# Patient Record
Sex: Male | Born: 1950 | Race: Black or African American | Hispanic: No | Marital: Married | Smoking: Never smoker
Health system: Southern US, Community
[De-identification: ages and names within clinical notes are randomized; demographics above are authoritative.]

## PROBLEM LIST (undated history)

## (undated) DIAGNOSIS — I4891 Unspecified atrial fibrillation: Secondary | ICD-10-CM

## (undated) DIAGNOSIS — E119 Type 2 diabetes mellitus without complications: Secondary | ICD-10-CM

## (undated) DIAGNOSIS — C61 Malignant neoplasm of prostate: Secondary | ICD-10-CM

## (undated) DIAGNOSIS — I10 Essential (primary) hypertension: Secondary | ICD-10-CM

## (undated) HISTORY — PX: PROSTATE BIOPSY: SHX241

---

## 2001-06-07 ENCOUNTER — Encounter: Payer: Self-pay | Admitting: General Practice

## 2001-06-07 ENCOUNTER — Encounter: Admission: RE | Admit: 2001-06-07 | Discharge: 2001-06-07 | Payer: Self-pay | Admitting: General Practice

## 2013-07-17 ENCOUNTER — Other Ambulatory Visit: Payer: Self-pay | Admitting: Gastroenterology

## 2013-07-17 DIAGNOSIS — Z1211 Encounter for screening for malignant neoplasm of colon: Secondary | ICD-10-CM

## 2013-07-20 ENCOUNTER — Other Ambulatory Visit: Payer: Self-pay

## 2013-12-13 ENCOUNTER — Ambulatory Visit
Admission: RE | Admit: 2013-12-13 | Discharge: 2013-12-13 | Disposition: A | Discharge status: Home / Self Care | Payer: BC Managed Care – PPO | Source: Ambulatory Visit | Attending: Internal Medicine | Admitting: Internal Medicine

## 2013-12-13 ENCOUNTER — Other Ambulatory Visit: Payer: Self-pay | Admitting: Internal Medicine

## 2013-12-13 DIAGNOSIS — J209 Acute bronchitis, unspecified: Secondary | ICD-10-CM

## 2015-02-03 ENCOUNTER — Emergency Department (HOSPITAL_COMMUNITY): Payer: No Typology Code available for payment source

## 2015-02-03 ENCOUNTER — Emergency Department (HOSPITAL_COMMUNITY)
Admission: EM | Admit: 2015-02-03 | Discharge: 2015-02-03 | Disposition: A | Discharge status: Home / Self Care | Payer: No Typology Code available for payment source | Source: Home / Self Care | Attending: Emergency Medicine | Admitting: Emergency Medicine

## 2015-02-03 ENCOUNTER — Encounter (HOSPITAL_COMMUNITY): Payer: Self-pay | Admitting: *Deleted

## 2015-02-03 DIAGNOSIS — R05 Cough: Secondary | ICD-10-CM

## 2015-02-03 DIAGNOSIS — R059 Cough, unspecified: Secondary | ICD-10-CM

## 2015-02-03 HISTORY — DX: Type 2 diabetes mellitus without complications: E11.9

## 2015-02-03 HISTORY — DX: Essential (primary) hypertension: I10

## 2015-02-03 MED ORDER — PREDNISONE 50 MG PO TABS: ORAL_TABLET | ORAL | Status: DC

## 2015-02-03 MED ORDER — AZITHROMYCIN 250 MG PO TABS: ORAL_TABLET | ORAL | Status: DC

## 2015-02-03 NOTE — ED Notes (Signed)
3rd day of myalgias, fever--did not check at home--and productive cough

## 2015-02-03 NOTE — Discharge Instructions (Signed)
I am worried that you have an infection in your lungs. Take prednisone 1 pill daily for 5 days. Take the azithromycin 2 pills today, then 1 pill daily until gone. If you are not getting better in 2 days, please follow-up here or with Dr. Jeanie Cooks.

## 2015-02-03 NOTE — ED Provider Notes (Signed)
CSN: [id number]     Arrival date & time 02/03/15  1132 History   First MD Initiated Contact with Patient 02/03/15 1224     Chief Complaint  Patient presents with  . Cough   (Consider location/radiation/quality/duration/timing/severity/associated sxs/prior Treatment) HPI  He is a 64 year old man here for evaluation of cough. He states this started on Friday with cough, subjective fever, chills, body aches. He denies any nasal congestion or rhinorrhea. No sore throat. No nausea or vomiting. He states he does sometimes feel short of breath. He will have some chest pain with coughing. He has been taking an over-the-counter cough medicine and Advil with some improvement. He states he saw his doctor on Friday, but was not prescribed any medication.  Past Medical History  Diagnosis Date  . Hypertension   . Diabetes mellitus without complication    History reviewed. No pertinent past surgical history. No family history on file. History  Substance Use Topics  . Smoking status: Never Smoker   . Smokeless tobacco: Not on file  . Alcohol Use: No    Review of Systems  Constitutional: Positive for fever and chills. Negative for activity change and appetite change.  HENT: Negative for congestion, rhinorrhea and sore throat.   Respiratory: Positive for cough and shortness of breath.   Cardiovascular: Positive for chest pain (with cough).  Gastrointestinal: Negative for nausea and vomiting.  Musculoskeletal: Positive for myalgias and arthralgias.    Allergies  Review of patient's allergies indicates no known allergies.  Home Medications   Prior to Admission medications   Medication Sig Start Date End Date Taking? Authorizing Provider  aspirin 81 MG tablet Take 81 mg by mouth daily.   Yes Historical Provider, MD  metFORMIN (GLUCOPHAGE) 500 MG tablet Take 500 mg by mouth 2 (two) times daily with a meal.   Yes Historical Provider, MD  azithromycin (ZITHROMAX Z-PAK) 250 MG tablet Take 2 pills  today, then 1 pill daily until gone. 02/03/15   Melony Overly, MD  predniSONE (DELTASONE) 50 MG tablet Take 1 pill daily for 5 days. 02/03/15   Melony Overly, MD   BP 120/78 mmHg  Pulse 79  Temp(Src) 98.6 F (37 C) (Oral)  Resp 18  SpO2 96% Physical Exam  Constitutional: He appears well-developed and well-nourished. No distress.  HENT:  Nose: Nose normal.  Mouth/Throat: Oropharynx is clear and moist. No oropharyngeal exudate.  Neck: Neck supple.  Cardiovascular: Normal rate, regular rhythm and normal heart sounds.   No murmur heard. Pulmonary/Chest: Effort normal and breath sounds normal. No respiratory distress. He has no wheezes. He has no rales.  Lymphadenopathy:    He has no cervical adenopathy.    ED Course  Procedures (including critical care time) Labs Review Labs Reviewed - No data to display  Imaging Review No results found.   MDM   1. Cough    He declined a chest x-ray. He does not have a fever here, but did take Advil this morning. I'm concerned about pneumonia versus bronchitis. We'll treat with azithromycin and prednisone. If no improvement in 2 days he will follow-up here or with his PCP.    Melony Overly, MD 02/03/15 1311

## 2016-01-30 ENCOUNTER — Other Ambulatory Visit: Payer: Self-pay | Admitting: Internal Medicine

## 2016-01-30 ENCOUNTER — Ambulatory Visit
Admission: RE | Admit: 2016-01-30 | Discharge: 2016-01-30 | Disposition: A | Discharge status: Home / Self Care | Payer: BLUE CROSS/BLUE SHIELD | Source: Ambulatory Visit | Attending: Internal Medicine | Admitting: Internal Medicine

## 2016-01-30 DIAGNOSIS — M1990 Unspecified osteoarthritis, unspecified site: Secondary | ICD-10-CM

## 2017-01-17 ENCOUNTER — Other Ambulatory Visit: Payer: Self-pay | Admitting: Urology

## 2017-01-17 DIAGNOSIS — C61 Malignant neoplasm of prostate: Secondary | ICD-10-CM

## 2017-01-31 ENCOUNTER — Encounter (HOSPITAL_COMMUNITY)
Admission: RE | Admit: 2017-01-31 | Discharge: 2017-01-31 | Disposition: A | Discharge status: Home / Self Care | Payer: BLUE CROSS/BLUE SHIELD | Source: Ambulatory Visit | Attending: Urology | Admitting: Urology

## 2017-01-31 ENCOUNTER — Ambulatory Visit (HOSPITAL_COMMUNITY)
Admission: RE | Admit: 2017-01-31 | Discharge: 2017-01-31 | Disposition: A | Discharge status: Home / Self Care | Payer: BLUE CROSS/BLUE SHIELD | Source: Ambulatory Visit | Attending: Urology | Admitting: Urology

## 2017-01-31 DIAGNOSIS — C61 Malignant neoplasm of prostate: Secondary | ICD-10-CM | POA: Diagnosis present

## 2017-01-31 DIAGNOSIS — M47894 Other spondylosis, thoracic region: Secondary | ICD-10-CM | POA: Insufficient documentation

## 2017-01-31 DIAGNOSIS — M47896 Other spondylosis, lumbar region: Secondary | ICD-10-CM | POA: Insufficient documentation

## 2017-01-31 MED ORDER — TECHNETIUM TC 99M MEDRONATE IV KIT
21.5000 | PACK | Freq: Once | INTRAVENOUS | Status: AC | PRN
  Administered 2017-01-31: 21.5 via INTRAVENOUS

## 2017-02-03 ENCOUNTER — Encounter: Payer: Self-pay | Admitting: Radiation Oncology

## 2017-02-04 ENCOUNTER — Encounter: Payer: Self-pay | Admitting: Radiation Oncology

## 2017-02-07 ENCOUNTER — Telehealth: Payer: Self-pay | Admitting: Radiation Oncology

## 2017-02-07 ENCOUNTER — Ambulatory Visit
Admission: RE | Admit: 2017-02-07 | Discharge: 2017-02-07 | Disposition: A | Discharge status: Home / Self Care | Payer: BLUE CROSS/BLUE SHIELD | Source: Ambulatory Visit | Attending: Radiation Oncology | Admitting: Radiation Oncology

## 2017-02-07 ENCOUNTER — Ambulatory Visit: Admission: RE | Admit: 2017-02-07 | Payer: BLUE CROSS/BLUE SHIELD | Source: Ambulatory Visit

## 2017-02-07 DIAGNOSIS — C61 Malignant neoplasm of prostate: Secondary | ICD-10-CM

## 2017-02-07 HISTORY — DX: Malignant neoplasm of prostate: C61

## 2017-02-07 NOTE — Progress Notes (Signed)
  Radiation Oncology         [phone]) [phone] ________________________________  Initial outpatient Consultation  Name: Phillip Williams MRN: [id number]  Date: 02/07/2017  DOB: 01-03-1951  [id number] A Jeanie Cooks, MD  Irine Seal, MD   REFERRING PHYSICIAN: Irine Seal, MD   DIAGNOSIS: 66 y.o. 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  MM

## 2017-02-07 NOTE — Telephone Encounter (Signed)
Patient has not shown for 0730 appointment. Phoned patient's home to inquire. Patient states, "I don't want radiation .. I want surgery." Patient confirms he has left a message with his urologist detailing this. Patient expressed appreciation for the call. Informed Dr. Tammi Klippel of this finding.

## 2017-02-24 ENCOUNTER — Other Ambulatory Visit: Payer: Self-pay | Admitting: Urology

## 2017-02-28 ENCOUNTER — Other Ambulatory Visit: Payer: Self-pay | Admitting: Urology

## 2017-03-03 NOTE — Progress Notes (Signed)
  11-05-16 Last office visit with labs 03-19-16 EKG on chart

## 2017-03-03 NOTE — Patient Instructions (Addendum)
Shail Urbas  03/03/2017   Your procedure is scheduled on: 03-17-17  Report to [facility] Main  Entrance Take [place]  elevators to 3rd floor to [place] at [place] AM.    Call this number if you have problems the morning of surgery [phone]    Remember: ONLY 1 PERSON MAY GO WITH YOU TO SHORT STAY TO GET  READY MORNING OF YOUR SURGERY.  Do not eat food or drink liquids :After Midnight.  Please have a clear liquid diet the day before surgery    CLEAR LIQUID DIET   Foods Allowed                                                                     Foods Excluded  Coffee and tea, regular and decaf                             liquids that you cannot  Plain Jell-O in any flavor                                             see through such as: Fruit ices (not with fruit pulp)                                     milk, soups, orange juice  Iced Popsicles                                    All solid food Carbonated beverages, regular and diet                                    Cranberry, grape and apple juices Sports drinks like Gatorade Lightly seasoned clear broth or consume(fat free) Sugar, honey syrup  Sample Menu Breakfast                                Lunch                                     Supper Cranberry juice                    Beef broth                            Chicken broth Jell-O                                     Grape juice  Apple juice Coffee or tea                        Jell-O                                      Popsicle                                                Coffee or tea                        Coffee or tea  _____________________________________________________________________    Take these medicines the morning of surgery with A SIP OF WATER: None  DO NOT TAKE ANY DIABETIC MEDICATIONS DAY OF YOUR SURGERY                               You may not have any metal on your body including hair  pins and              piercings  Do not wear jewelry, make-up, lotions, powders or perfumes, deodorant                          Men may shave face and neck.   Do not bring valuables to the hospital. [place].  Contacts, dentures or bridgework may not be worn into surgery.  Leave suitcase in the car. After surgery it may be brought to your room.     _____________________________________________________________________            How to Manage Your Diabetes Before and After Surgery  Why is it important to control my blood sugar before and after surgery? . Improving blood sugar levels before and after surgery helps healing and can limit problems. . A way of improving blood sugar control is eating a healthy diet by: o  Eating less sugar and carbohydrates o  Increasing activity/exercise o  Talking with your doctor about reaching your blood sugar goals . High blood sugars (greater than 180 mg/dL) can raise your risk of infections and slow your recovery, so you will need to focus on controlling your diabetes during the weeks before surgery. . Make sure that the doctor who takes care of your diabetes knows about your planned surgery including the date and location.  How do I manage my blood sugar before surgery? . Check your blood sugar at least 4 times a day, starting 2 days before surgery, to make sure that the level is not too high or low. o Check your blood sugar the morning of your surgery when you wake up and every 2 hours until you get to the Short Stay unit. . If your blood sugar is less than 70 mg/dL, you will need to treat for low blood sugar: o Do not take insulin. o Treat a low blood sugar (less than 70 mg/dL) with  cup of clear juice (cranberry or apple), 4 glucose tablets, OR glucose gel. o Recheck blood sugar in 15 minutes after treatment (to make sure it  is greater than 70 mg/dL). If your blood sugar is not greater than 70  mg/dL on recheck, call [phone] for further instructions. . Report your blood sugar to the short stay nurse when you get to Short Stay.  . If you are admitted to the hospital after surgery: o Your blood sugar will be checked by the staff and you will probably be given insulin after surgery (instead of oral diabetes medicines) to make sure you have good blood sugar levels. o The goal for blood sugar control after surgery is 80-180 mg/dL.   WHAT DO I DO ABOUT MY DIABETES MEDICATION?  Marland Kitchen Do not take oral diabetes medicines (pills) the morning of surgery.  . THE DAY BEFORE SURGERY, take your usual dose of Metformin      . The day of surgery, do not take other diabetes injectables, including Byetta (exenatide), Bydureon (exenatide ER), Victoza (liraglutide), or Trulicity (dulaglutide).    Patient Signature:  Date:   Nurse Signature:  Date:   Reviewed and Endorsed by [facility] Patient Education Committee, August 2015[facility] - Preparing for Surgery Before surgery, you can play an important role.  Because skin is not sterile, your skin needs to be as free of germs as possible.  You can reduce the number of germs on your skin by washing with CHG (chlorahexidine gluconate) soap before surgery.  CHG is an antiseptic cleaner which kills germs and bonds with the skin to continue killing germs even after washing. Please DO NOT use if you have an allergy to CHG or antibacterial soaps.  If your skin becomes reddened/irritated stop using the CHG and inform your nurse when you arrive at Short Stay. Do not shave (including legs and underarms) for at least 48 hours prior to the first CHG shower.  You may shave your face/neck. Please follow these instructions carefully:  1.  Shower with CHG Soap the night before surgery and the  morning of Surgery.  2.  If you choose to wash your hair, wash your hair first as usual with your  normal  shampoo.  3.  After you shampoo, rinse your hair and body  thoroughly to remove the  shampoo.                           4.  Use CHG as you would any other liquid soap.  You can apply chg directly  to the skin and wash                       Gently with a scrungie or clean washcloth.  5.  Apply the CHG Soap to your body ONLY FROM THE NECK DOWN.   Do not use on face/ open                           Wound or open sores. Avoid contact with eyes, ears mouth and genitals (private parts).                       Wash face,  Genitals (private parts) with your normal soap.             6.  Wash thoroughly, paying special attention to the area where your surgery  will be performed.  7.  Thoroughly rinse your body with warm water from the neck down.  8.  DO NOT shower/wash with your normal soap  after using and rinsing off  the CHG Soap.                9.  Pat yourself dry with a clean towel.            10.  Wear clean pajamas.            11.  Place clean sheets on your bed the night of your first shower and do not  sleep with pets. Day of Surgery : Do not apply any lotions/deodorants the morning of surgery.  Please wear clean clothes to the hospital/surgery center.  FAILURE TO FOLLOW THESE INSTRUCTIONS MAY RESULT IN THE CANCELLATION OF YOUR SURGERY PATIENT SIGNATURE_________________________________  NURSE SIGNATURE__________________________________  ________________________________________________________________________   Adam Phenix  An incentive spirometer is a tool that can help keep your lungs clear and active. This tool measures how well you are filling your lungs with each breath. Taking long deep breaths may help reverse or decrease the chance of developing breathing (pulmonary) problems (especially infection) following:  A long period of time when you are unable to move or be active. BEFORE THE PROCEDURE   If the spirometer includes an indicator to show your best effort, your nurse or respiratory therapist will set it to a desired goal.  If  possible, sit up straight or lean slightly forward. Try not to slouch.  Hold the incentive spirometer in an upright position. INSTRUCTIONS FOR USE  1. Sit on the edge of your bed if possible, or sit up as far as you can in bed or on a chair. 2. Hold the incentive spirometer in an upright position. 3. Breathe out normally. 4. Place the mouthpiece in your mouth and seal your lips tightly around it. 5. Breathe in slowly and as deeply as possible, raising the piston or the ball toward the top of the column. 6. Hold your breath for 3-5 seconds or for as long as possible. Allow the piston or ball to fall to the bottom of the column. 7. Remove the mouthpiece from your mouth and breathe out normally. 8. Rest for a few seconds and repeat Steps 1 through 7 at least 10 times every 1-2 hours when you are awake. Take your time and take a few normal breaths between deep breaths. 9. The spirometer may include an indicator to show your best effort. Use the indicator as a goal to work toward during each repetition. 10. After each set of 10 deep breaths, practice coughing to be sure your lungs are clear. If you have an incision (the cut made at the time of surgery), support your incision when coughing by placing a pillow or rolled up towels firmly against it. Once you are able to get out of bed, walk around indoors and cough well. You may stop using the incentive spirometer when instructed by your caregiver.  RISKS AND COMPLICATIONS  Take your time so you do not get dizzy or light-headed.  If you are in pain, you may need to take or ask for pain medication before doing incentive spirometry. It is harder to take a deep breath if you are having pain. AFTER USE  Rest and breathe slowly and easily.  It can be helpful to keep track of a log of your progress. Your caregiver can provide you with a simple table to help with this. If you are using the spirometer at home, follow these instructions: [place]  IF:   You are having difficultly using the spirometer.  You  have trouble using the spirometer as often as instructed.  Your pain medication is not giving enough relief while using the spirometer.  You develop fever of 100.5 F (38.1 C) or higher. SEEK IMMEDIATE MEDICAL CARE IF:   You cough up bloody sputum that had not been present before.  You develop fever of 102 F (38.9 C) or greater.  You develop worsening pain at or near the incision site. MAKE SURE YOU:   Understand these instructions.  Will watch your condition.  Will get help right away if you are not doing well or get worse. Document Released: 02/21/2007 Document Revised: 01/03/2012 Document Reviewed: 04/24/2007 Exit[employer] Patient Information 2014 Exit[employer], [place].   ________________________________________________________________________     WHAT IS A BLOOD TRANSFUSION? Blood Transfusion Information  A transfusion is the replacement of blood or some of its parts. Blood is made up of multiple cells which provide different functions.  Red blood cells carry oxygen and are used for blood loss replacement.  White blood cells fight against infection.  Platelets control bleeding.  Plasma helps clot blood.  Other blood products are available for specialized needs, such as hemophilia or other clotting disorders. BEFORE THE TRANSFUSION  Who gives blood for transfusions?   Healthy volunteers who are fully evaluated to make sure their blood is safe. This is blood bank blood. Transfusion therapy is the safest it has ever been in the practice of medicine. Before blood is taken from a donor, a complete history is taken to make sure that person has no history of diseases nor engages in risky social behavior (examples are intravenous drug use or sexual activity with multiple partners). The donor's travel history is screened to minimize risk of transmitting infections, such as malaria. The donated blood is tested for signs of  infectious diseases, such as HIV and hepatitis. The blood is then tested to be sure it is compatible with you in order to minimize the chance of a transfusion reaction. If you or a relative donates blood, this is often done in anticipation of surgery and is not appropriate for emergency situations. It takes many days to process the donated blood. RISKS AND COMPLICATIONS Although transfusion therapy is very safe and saves many lives, the main dangers of transfusion include:   Getting an infectious disease.  Developing a transfusion reaction. This is an allergic reaction to something in the blood you were given. Every precaution is taken to prevent this. The decision to have a blood transfusion has been considered carefully by your caregiver before blood is given. Blood is not given unless the benefits outweigh the risks. AFTER THE TRANSFUSION  Right after receiving a blood transfusion, you will usually feel much better and more energetic. This is especially true if your red blood cells have gotten low (anemic). The transfusion raises the level of the red blood cells which carry oxygen, and this usually causes an energy increase.  The nurse administering the transfusion will monitor you carefully for complications. HOME CARE INSTRUCTIONS  No special instructions are needed after a transfusion. You may find your energy is better. Speak with your caregiver about any limitations on activity for underlying diseases you may have. SEEK MEDICAL CARE IF:   Your condition is not improving after your transfusion.  You develop redness or irritation at the intravenous (IV) site. SEEK IMMEDIATE MEDICAL CARE IF:  Any of the following symptoms occur over the next 12 hours:  Shaking chills.  You have a temperature by mouth above 102 F (38.9 C),  not controlled by medicine.  Chest, back, or muscle pain.  People around you feel you are not acting correctly or are confused.  Shortness of breath or  difficulty breathing.  Dizziness and fainting.  You get a rash or develop hives.  You have a decrease in urine output.  Your urine turns a dark color or changes to pink, red, or brown. Any of the following symptoms occur over the next 10 days:  You have a temperature by mouth above 102 F (38.9 C), not controlled by medicine.  Shortness of breath.  Weakness after normal activity.  The white part of the eye turns yellow (jaundice).  You have a decrease in the amount of urine or are urinating less often.  Your urine turns a dark color or changes to pink, red, or brown. Document Released: 10/08/2000 Document Revised: 01/03/2012 Document Reviewed: 05/27/2008 [facility] Patient Information 2014 [place], [place].  _______________________________________________________________________

## 2017-03-07 ENCOUNTER — Encounter (HOSPITAL_COMMUNITY): Payer: Self-pay

## 2017-03-07 ENCOUNTER — Ambulatory Visit (HOSPITAL_COMMUNITY)
Admission: RE | Admit: 2017-03-07 | Discharge: 2017-03-07 | Disposition: A | Discharge status: Home / Self Care | Payer: BLUE CROSS/BLUE SHIELD | Source: Ambulatory Visit | Attending: Anesthesiology | Admitting: Anesthesiology

## 2017-03-07 ENCOUNTER — Encounter (HOSPITAL_COMMUNITY)
Admission: RE | Admit: 2017-03-07 | Discharge: 2017-03-07 | Disposition: A | Discharge status: Home / Self Care | Payer: BLUE CROSS/BLUE SHIELD | Source: Ambulatory Visit | Attending: Urology | Admitting: Urology

## 2017-03-07 DIAGNOSIS — C61 Malignant neoplasm of prostate: Secondary | ICD-10-CM | POA: Diagnosis not present

## 2017-03-07 DIAGNOSIS — Z01810 Encounter for preprocedural cardiovascular examination: Secondary | ICD-10-CM | POA: Diagnosis present

## 2017-03-07 DIAGNOSIS — R918 Other nonspecific abnormal finding of lung field: Secondary | ICD-10-CM | POA: Insufficient documentation

## 2017-03-07 DIAGNOSIS — Z01818 Encounter for other preprocedural examination: Secondary | ICD-10-CM | POA: Diagnosis not present

## 2017-03-07 DIAGNOSIS — I10 Essential (primary) hypertension: Secondary | ICD-10-CM | POA: Insufficient documentation

## 2017-03-07 LAB — BASIC METABOLIC PANEL
ANION GAP: 9 (ref 5–15)
BUN: 17 mg/dL (ref 6–20)
CALCIUM: 10.1 mg/dL (ref 8.9–10.3)
CO2: 29 mmol/L (ref 22–32)
Chloride: 99 mmol/L — ABNORMAL LOW (ref 101–111)
Creatinine, Ser: 1.41 mg/dL — ABNORMAL HIGH (ref 0.61–1.24)
GFR, EST AFRICAN AMERICAN: 58 mL/min — AB (ref >60)
GFR, EST NON AFRICAN AMERICAN: 50 mL/min — AB (ref >60)
GLUCOSE: 109 mg/dL — AB (ref 65–99)
Potassium: 4.4 mmol/L (ref 3.5–5.1)
SODIUM: 137 mmol/L (ref 135–145)

## 2017-03-07 LAB — CBC
HCT: 44.8 % (ref 39.0–52.0)
HEMOGLOBIN: 15.5 g/dL (ref 13.0–17.0)
MCH: 28.9 pg (ref 26.0–34.0)
MCHC: 34.6 g/dL (ref 30.0–36.0)
MCV: 83.4 fL (ref 78.0–100.0)
Platelets: 241 10*3/uL (ref 150–400)
RBC: 5.37 MIL/uL (ref 4.22–5.81)
RDW: 14.5 % (ref 11.5–15.5)
WBC: 7.2 10*3/uL (ref 4.0–10.5)

## 2017-03-07 LAB — ABO/RH: ABO/RH(D): A POS

## 2017-03-07 LAB — GLUCOSE, CAPILLARY: GLUCOSE-CAPILLARY: 130 mg/dL — AB (ref 65–99)

## 2017-03-08 LAB — HEMOGLOBIN A1C
HEMOGLOBIN A1C: 6.3 % — AB (ref 4.8–5.6)
Mean Plasma Glucose: 134 mg/dL

## 2017-03-16 NOTE — H&P (Signed)
Office Visit Report     02/22/2017   --------------------------------------------------------------------------------   Phillip Williams  MRN: [id number]  PRIMARY CARE:  Nolene Ebbs, MD  DOB: Dec 12, 1950, 66 year old Male  REFERRING:  Nolene Ebbs, MD  SSN: -[id number]  PROVIDER:  Irine Seal, M.D.    TREATING:  Raynelle Bring, M.D.    LOCATION:  [facility] [phone]   --------------------------------------------------------------------------------   CC/HPI: CC: Prostate Cancer   Physician requesting consult: Dr. Irine Seal  PCP: Dr. Nolene Ebbs   Phillip Williams is a 66 year old gentleman from [place] who was noted to have an elevated PSA of 15.2 prompting a TRUS biopsy of the prostate on 01/10/17. This demonstrated Gleason 4+4=8 adenocarcinoma of the prostate with 5 out of 13 biopsy cores positive for malignancy.   Family history: None.   Imaging studies:  CT scan (01/31/17) - Negative for metastatic disease.  Bone scan (01/31/17) - Questionable uptake at the left 8th rib   PMH: He has a history of diabetes and hypertension.  PSH: No abdominal surgeries.   TNM stage: cT1c N0 M0  PSA: 15.2  Gleason score: 4+4=8  Biopsy (01/10/17): 5/13 cores positive  Left: L base (5%, 3+4=7)  Right: R mid (5%, 3+3=6), R lateral base (90%, 4+3=7, PNI), R base (20%, 4+3=7), R base hypoechoic lesion (90%, 4+4=8, PNI)  Prostate volume: 96.5 cc   Nomogram  OC disease: 17%  EPE: 80%  SVI: 22%  LNI: 23%  PFS (5 year, 10 year): 31%,19%   Urinary function: IPSS is 12.  Erectile function: SHIM score is 10. He has not been sexually active in approximately 7 years.     ALLERGIES: None   MEDICATIONS: Metformin Hcl  Aspirin Ec 81 mg tablet, delayed release  Tribenzor     GU PSH: Complex Uroflow - 01/10/2017 Locm 300-399Mg /Ml Iodine,1Ml - 01/31/2017 Prostate Needle Biopsy - 01/10/2017    NON-GU PSH: Surgical Pathology, Gross And Microscopic Examination For Prostate  Needle - 01/10/2017    GU PMH: Prostate Cancer, He has T1c N0 M(?) Gleason 7(4+3) intermediate to high risk prostate cancer with moderate to severe ED and moderate LUTs with a 58ml prostate. He is most interested in radiation therapy with adjuvant ADT and I will get him set up to see Rad Onc and return for gold seeds and firmagon. I am going to order left rib films to make sure the bone scan findings are not significant. - 02/02/2017 Elevated PSA (Worsening), He has multiple hypoechoic areas that are suspicious for neoplasm in his 45ml prostate. - 01/10/2017, He has a progressively rising PSA and needs a biopsy. I will repeat the PSA today since he hasn't had one in a year. I reviewed the risks of the biopsy including bleeding, infection and voiding difficulty. , - 12/20/2016 BPH w/LUTS, He has a slow stream and an enlarged prostate and I will get a flowrate and PVR on return. - 12/20/2016 Weak Urinary Stream - 12/20/2016    NON-GU PMH: Glycosuria, He has 3+ glucose today and is supposed to see Dr. Jeanie Cooks this afternoon. - 12/20/2016 Arthritis Diabetes Type 2 Encounter for general adult medical examination without abnormal findings, Encounter for preventive health examination Hypertension    FAMILY HISTORY: None   SOCIAL HISTORY: Marital Status: Married Current Smoking Status: Patient has never smoked.   Tobacco Use Assessment Completed: Used Tobacco in last 30 days?     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    REVIEW  OF SYSTEMS:    GU Review Male:   Patient denies frequent urination, hard to postpone urination, burning/ pain with urination, get up at night to urinate, leakage of urine, stream starts and stops, trouble starting your streams, and have to strain to urinate .  Gastrointestinal (Lower):   Patient denies diarrhea and constipation.  Gastrointestinal (Upper):   Patient denies nausea and vomiting.  Constitutional:   Patient denies fever, night sweats, weight loss, and fatigue.  Skin:    Patient denies skin rash/ lesion and itching.  Eyes:   Patient denies blurred vision and double vision.  Ears/ Nose/ Throat:   Patient denies sore throat and sinus problems.  Hematologic/Lymphatic:   Patient denies swollen glands and easy bruising.  Cardiovascular:   Patient denies leg swelling and chest pains.  Respiratory:   Patient denies shortness of breath and cough.  Endocrine:   Patient denies excessive thirst.  Musculoskeletal:   Patient denies back pain and joint pain.  Neurological:   Patient denies headaches and dizziness.  Psychologic:   Patient denies depression and anxiety.   VITAL SIGNS:      02/22/2017 03:38 PM  Weight 250 lb / 113.4 kg  Height 71 in / 180.34 cm  BP 108/72 mmHg  Pulse 89 /min  BMI 34.9 kg/m   MULTI-SYSTEM PHYSICAL EXAMINATION:    Constitutional: Well-nourished. No physical deformities. Normally developed. Good grooming.  Neck: Neck symmetrical, not swollen. Normal tracheal position.  Respiratory: No labored breathing, no use of accessory muscles. Clear bilaterally.  Cardiovascular: Normal temperature, normal extremity pulses, no swelling, no varicosities. Regular rate and rhythm.  Lymphatic: No enlargement of neck, axillae, groin.  Skin: No paleness, no jaundice, no cyanosis. No lesion, no ulcer, no rash.  Neurologic / Psychiatric: Oriented to time, oriented to place, oriented to person. No depression, no anxiety, no agitation.  Gastrointestinal: No mass, no tenderness, no rigidity, obese.  Eyes: Normal conjunctivae. Normal eyelids.  Ears, Nose, Mouth, and Throat: Left ear no scars, no lesions, no masses. Right ear no scars, no lesions, no masses. Nose no scars, no lesions, no masses. Normal hearing. Normal lips.  Musculoskeletal: Normal gait and station of head and neck.     PAST DATA REVIEWED:  Source Of History:  Patient  Lab Test Review:   PSA  Records Review:   Pathology Reports, Previous Patient Records  Urine Test Review:   Urinalysis   X-Ray Review: C.T. Abdomen/Pelvis: Reviewed Films.  Bone Scan: Reviewed Films.     12/20/16 12/18/15 04/24/15 05/08/14  PSA  Total PSA 15.20 ng/dl 8.29 ng/dl 6.3 ng/dl 6.22 ng/dl    PROCEDURES:          Urinalysis Dipstick Dipstick Cont'd  Color: Yellow Bilirubin: Neg  Appearance: Clear Ketones: Neg  Specific Gravity: 1.020 Blood: Neg  pH: 6.0 Protein: Neg  Glucose: 3+ Urobilinogen: 0.2    Nitrites: Neg    Leukocyte Esterase: Neg    ASSESSMENT:      ICD-10 Details  1 GU:   Prostate Cancer - C61    PLAN:           Schedule Return Visit/Planned Activity: Other See Visit Notes             Note: Will call schedule surgery  Return Visit/Planned Activity: Next Available Appointment - PT/OT Referral             Note: Please schedule patient for preoperative PT prior to radical prostatectomy.  Document Letter(s):  Created for Patient: Clinical Summary         Notes:   1. Prostate cancer: The patient was counseled about the natural history of prostate cancer and the standard treatment options that are available for prostate cancer. It was explained to him how his age and life expectancy, clinical stage, Gleason score, and PSA affect his prognosis, the decision to proceed with additional staging studies, as well as how that information influences recommended treatment strategies. We discussed the roles for active surveillance, radiation therapy, surgical therapy, androgen deprivation, as well as ablative therapy options for the treatment of prostate cancer as appropriate to his individual cancer situation. We discussed the risks and benefits of these options with regard to their impact on cancer control and also in terms of potential adverse events, complications, and impact on quality of life particularly related to urinary and sexual function. The patient was encouraged to ask questions throughout the discussion today and all questions were answered to his stated  satisfaction. In addition, the patient was provided with and/or directed to appropriate resources and literature for further education about prostate cancer and treatment options.   We discussed surgical therapy for prostate cancer including the different available surgical approaches. We discussed, in detail, the risks and expectations of surgery with regard to cancer control, urinary control, and erectile function as well as the expected postoperative recovery process. Additional risks of surgery including but not limited to bleeding, infection, hernia formation, nerve damage, lymphocele formation, bowel/rectal injury potentially necessitating colostomy, damage to the urinary tract resulting in urine leakage, urethral stricture, and the cardiopulmonary risks such as myocardial infarction, stroke, death, venothromboembolism, etc. were explained. The risk of open surgical conversion for robotic/laparoscopic prostatectomy was also discussed.    We discussed his bone scan which is equivocal. I still recommended therapy of curative intent. We discussed the surgical therapy and radiation therapy plus long-term androgen deprivation as reasonable approaches. Ultimately, considering his large prostate size and baseline lower urinary tract symptoms as well as his desire to avoid androgen deprivation, he feels most comfortable proceeding with primary surgical therapy. My tentative plan would be to perform a left unilateral nerve sparing robot-assisted laparoscopic radical prostatectomy and bilateral pelvic lymphadenectomy.   Cc: Dr. Irine Seal  Dr. Evie Lacks         * Signed by Raynelle Bring, M.D. on 02/22/17 at 10:45 PM (EDT)*

## 2017-03-17 ENCOUNTER — Encounter (HOSPITAL_COMMUNITY)
Admission: RE | Disposition: A | Discharge status: Home / Self Care | Payer: Self-pay | Source: Ambulatory Visit | Attending: Urology

## 2017-03-17 ENCOUNTER — Encounter (HOSPITAL_COMMUNITY): Payer: Self-pay

## 2017-03-17 ENCOUNTER — Ambulatory Visit (HOSPITAL_COMMUNITY): Payer: BLUE CROSS/BLUE SHIELD | Admitting: Registered Nurse

## 2017-03-17 ENCOUNTER — Observation Stay (HOSPITAL_COMMUNITY)
Admission: RE | Admit: 2017-03-17 | Discharge: 2017-03-18 | Disposition: A | Discharge status: Home / Self Care | Payer: BLUE CROSS/BLUE SHIELD | Source: Ambulatory Visit | Attending: Urology | Admitting: Urology

## 2017-03-17 DIAGNOSIS — C61 Malignant neoplasm of prostate: Principal | ICD-10-CM | POA: Insufficient documentation

## 2017-03-17 DIAGNOSIS — I10 Essential (primary) hypertension: Secondary | ICD-10-CM | POA: Insufficient documentation

## 2017-03-17 DIAGNOSIS — Z7982 Long term (current) use of aspirin: Secondary | ICD-10-CM | POA: Diagnosis not present

## 2017-03-17 DIAGNOSIS — Z79899 Other long term (current) drug therapy: Secondary | ICD-10-CM | POA: Insufficient documentation

## 2017-03-17 DIAGNOSIS — E119 Type 2 diabetes mellitus without complications: Secondary | ICD-10-CM | POA: Diagnosis not present

## 2017-03-17 HISTORY — PX: ROBOT ASSISTED LAPAROSCOPIC RADICAL PROSTATECTOMY: SHX5141

## 2017-03-17 HISTORY — PX: LYMPHADENECTOMY: SHX5960

## 2017-03-17 LAB — HEMOGLOBIN AND HEMATOCRIT, BLOOD
HCT: 41.4 % (ref 39.0–52.0)
Hemoglobin: 14.4 g/dL (ref 13.0–17.0)

## 2017-03-17 LAB — GLUCOSE, CAPILLARY
GLUCOSE-CAPILLARY: 104 mg/dL — AB (ref 65–99)
GLUCOSE-CAPILLARY: 188 mg/dL — AB (ref 65–99)
Glucose-Capillary: 213 mg/dL — ABNORMAL HIGH (ref 65–99)

## 2017-03-17 LAB — TYPE AND SCREEN
ABO/RH(D): A POS
ANTIBODY SCREEN: NEGATIVE

## 2017-03-17 SURGERY — XI ROBOTIC ASSISTED LAPAROSCOPIC RADICAL PROSTATECTOMY LEVEL 2
Anesthesia: General

## 2017-03-17 MED ORDER — LIDOCAINE 2% (20 MG/ML) 5 ML SYRINGE
INTRAMUSCULAR | Status: AC
  Filled 2017-03-17: qty 5

## 2017-03-17 MED ORDER — HYDROMORPHONE HCL 1 MG/ML IJ SOLN
INTRAMUSCULAR | Status: AC
  Administered 2017-03-17: 0.5 mg via INTRAVENOUS
  Filled 2017-03-17: qty 0.5

## 2017-03-17 MED ORDER — DOCUSATE SODIUM 100 MG PO CAPS
100.0000 mg | ORAL_CAPSULE | Freq: Two times a day (BID) | ORAL | Status: DC
  Administered 2017-03-17 – 2017-03-18 (×2): 100 mg via ORAL
  Filled 2017-03-17 (×2): qty 1

## 2017-03-17 MED ORDER — EPHEDRINE 5 MG/ML INJ
INTRAVENOUS | Status: AC
  Filled 2017-03-17: qty 10

## 2017-03-17 MED ORDER — SUGAMMADEX SODIUM 200 MG/2ML IV SOLN
INTRAVENOUS | Status: DC | PRN
  Administered 2017-03-17: 200 mg via INTRAVENOUS

## 2017-03-17 MED ORDER — LACTATED RINGERS IV SOLN
INTRAVENOUS | Status: DC
  Administered 2017-03-17: 07:00:00 via INTRAVENOUS

## 2017-03-17 MED ORDER — OLOPATADINE HCL 0.1 % OP SOLN
1.0000 [drp] | Freq: Every day | OPHTHALMIC | Status: DC
  Administered 2017-03-17: 1 [drp] via OPHTHALMIC
  Filled 2017-03-17: qty 5

## 2017-03-17 MED ORDER — HYDROMORPHONE HCL 1 MG/ML IJ SOLN
INTRAMUSCULAR | Status: AC
  Administered 2017-03-17: 0.25 mg via INTRAVENOUS
  Filled 2017-03-17: qty 0.5

## 2017-03-17 MED ORDER — MIDAZOLAM HCL 5 MG/5ML IJ SOLN
INTRAMUSCULAR | Status: DC | PRN
  Administered 2017-03-17: 2 mg via INTRAVENOUS

## 2017-03-17 MED ORDER — SUCCINYLCHOLINE CHLORIDE 200 MG/10ML IV SOSY
PREFILLED_SYRINGE | INTRAVENOUS | Status: DC | PRN
  Administered 2017-03-17: 100 mg via INTRAVENOUS

## 2017-03-17 MED ORDER — SCOPOLAMINE 1 MG/3DAYS TD PT72
MEDICATED_PATCH | TRANSDERMAL | Status: DC | PRN
  Administered 2017-03-17: 1 via TRANSDERMAL

## 2017-03-17 MED ORDER — PROPOFOL 10 MG/ML IV BOLUS
INTRAVENOUS | Status: AC
  Filled 2017-03-17: qty 20

## 2017-03-17 MED ORDER — FENTANYL CITRATE (PF) 100 MCG/2ML IJ SOLN
INTRAMUSCULAR | Status: DC | PRN
  Administered 2017-03-17 (×2): 50 ug via INTRAVENOUS
  Administered 2017-03-17: 100 ug via INTRAVENOUS
  Administered 2017-03-17: 50 ug via INTRAVENOUS

## 2017-03-17 MED ORDER — PROPOFOL 10 MG/ML IV BOLUS
INTRAVENOUS | Status: DC | PRN
  Administered 2017-03-17: 200 mg via INTRAVENOUS

## 2017-03-17 MED ORDER — AMLODIPINE BESYLATE 10 MG PO TABS
10.0000 mg | ORAL_TABLET | Freq: Every evening | ORAL | Status: DC
  Administered 2017-03-17: 10 mg via ORAL
  Filled 2017-03-17: qty 1

## 2017-03-17 MED ORDER — SUCCINYLCHOLINE CHLORIDE 200 MG/10ML IV SOSY
PREFILLED_SYRINGE | INTRAVENOUS | Status: AC
  Filled 2017-03-17: qty 10

## 2017-03-17 MED ORDER — ONDANSETRON HCL 4 MG/2ML IJ SOLN
INTRAMUSCULAR | Status: DC | PRN
  Administered 2017-03-17: 4 mg via INTRAVENOUS

## 2017-03-17 MED ORDER — LACTATED RINGERS IV SOLN
INTRAVENOUS | Status: DC | PRN
  Administered 2017-03-17: 08:00:00

## 2017-03-17 MED ORDER — OLMESARTAN-AMLODIPINE-HCTZ 40-10-25 MG PO TABS: 1.0000 | ORAL_TABLET | Freq: Every evening | ORAL | Status: DC

## 2017-03-17 MED ORDER — EPHEDRINE SULFATE-NACL 50-0.9 MG/10ML-% IV SOSY
PREFILLED_SYRINGE | INTRAVENOUS | Status: DC | PRN
  Administered 2017-03-17 (×2): 5 mg via INTRAVENOUS
  Administered 2017-03-17: 10 mg via INTRAVENOUS
  Administered 2017-03-17 (×2): 5 mg via INTRAVENOUS

## 2017-03-17 MED ORDER — MEPERIDINE HCL 50 MG/ML IJ SOLN: 6.2500 mg | INTRAMUSCULAR | Status: DC | PRN

## 2017-03-17 MED ORDER — ACETAMINOPHEN 325 MG PO TABS: 650.0000 mg | ORAL_TABLET | ORAL | Status: DC | PRN

## 2017-03-17 MED ORDER — SODIUM CHLORIDE 0.9 % IV BOLUS (SEPSIS): 1000.0000 mL | Freq: Once | INTRAVENOUS | Status: DC

## 2017-03-17 MED ORDER — KCL IN DEXTROSE-NACL 20-5-0.45 MEQ/L-%-% IV SOLN
INTRAVENOUS | Status: DC
  Administered 2017-03-17 – 2017-03-18 (×3): via INTRAVENOUS
  Filled 2017-03-17 (×3): qty 1000

## 2017-03-17 MED ORDER — BUPIVACAINE HCL (PF) 0.25 % IJ SOLN
INTRAMUSCULAR | Status: AC
  Filled 2017-03-17: qty 30

## 2017-03-17 MED ORDER — ROCURONIUM BROMIDE 50 MG/5ML IV SOSY
PREFILLED_SYRINGE | INTRAVENOUS | Status: AC
  Filled 2017-03-17: qty 5

## 2017-03-17 MED ORDER — SODIUM CHLORIDE 0.9 % IR SOLN
Status: DC | PRN
  Administered 2017-03-17: 1000 mL

## 2017-03-17 MED ORDER — CEFAZOLIN SODIUM-DEXTROSE 2-4 GM/100ML-% IV SOLN
2.0000 g | INTRAVENOUS | Status: AC
  Administered 2017-03-17 (×2): 2 g via INTRAVENOUS
  Filled 2017-03-17: qty 100

## 2017-03-17 MED ORDER — CEFAZOLIN SODIUM-DEXTROSE 1-4 GM/50ML-% IV SOLN
1.0000 g | Freq: Three times a day (TID) | INTRAVENOUS | Status: AC
  Administered 2017-03-17 – 2017-03-18 (×2): 1 g via INTRAVENOUS
  Filled 2017-03-17 (×2): qty 50

## 2017-03-17 MED ORDER — ROCURONIUM BROMIDE 10 MG/ML (PF) SYRINGE
PREFILLED_SYRINGE | INTRAVENOUS | Status: DC | PRN
  Administered 2017-03-17 (×4): 10 mg via INTRAVENOUS
  Administered 2017-03-17: 50 mg via INTRAVENOUS
  Administered 2017-03-17: 10 mg via INTRAVENOUS

## 2017-03-17 MED ORDER — FLUORESCEIN SODIUM 10 % IV SOLN
INTRAVENOUS | Status: DC | PRN
  Administered 2017-03-17: 50 mg via INTRAVENOUS

## 2017-03-17 MED ORDER — LIDOCAINE 2% (20 MG/ML) 5 ML SYRINGE
INTRAMUSCULAR | Status: DC | PRN
  Administered 2017-03-17: 100 mg via INTRAVENOUS

## 2017-03-17 MED ORDER — FENTANYL CITRATE (PF) 250 MCG/5ML IJ SOLN
INTRAMUSCULAR | Status: AC
  Filled 2017-03-17: qty 5

## 2017-03-17 MED ORDER — MIDAZOLAM HCL 2 MG/2ML IJ SOLN
INTRAMUSCULAR | Status: AC
  Filled 2017-03-17: qty 2

## 2017-03-17 MED ORDER — ONDANSETRON HCL 4 MG/2ML IJ SOLN
INTRAMUSCULAR | Status: AC
  Filled 2017-03-17: qty 2

## 2017-03-17 MED ORDER — FLUORESCEIN SODIUM 10 % IV SOLN
INTRAVENOUS | Status: AC
  Filled 2017-03-17: qty 5

## 2017-03-17 MED ORDER — HYDROMORPHONE HCL 2 MG/ML IJ SOLN
INTRAMUSCULAR | Status: AC
  Filled 2017-03-17: qty 1

## 2017-03-17 MED ORDER — OXYCODONE HCL 5 MG PO TABS: 5.0000 mg | ORAL_TABLET | Freq: Once | ORAL | Status: DC | PRN

## 2017-03-17 MED ORDER — SUGAMMADEX SODIUM 200 MG/2ML IV SOLN
INTRAVENOUS | Status: AC
  Filled 2017-03-17: qty 2

## 2017-03-17 MED ORDER — CEFAZOLIN SODIUM-DEXTROSE 2-4 GM/100ML-% IV SOLN
INTRAVENOUS | Status: AC
  Filled 2017-03-17: qty 100

## 2017-03-17 MED ORDER — OLOPATADINE HCL 0.7 % OP SOLN: 1.0000 [drp] | Freq: Every day | OPHTHALMIC | Status: DC

## 2017-03-17 MED ORDER — INSULIN ASPART 100 UNIT/ML ~~LOC~~ SOLN
0.0000 [IU] | SUBCUTANEOUS | Status: DC
  Administered 2017-03-17: 3 [IU] via SUBCUTANEOUS
  Administered 2017-03-17: 5 [IU] via SUBCUTANEOUS
  Administered 2017-03-18 (×2): 2 [IU] via SUBCUTANEOUS

## 2017-03-17 MED ORDER — OXYCODONE HCL 5 MG/5ML PO SOLN
5.0000 mg | Freq: Once | ORAL | Status: DC | PRN
  Filled 2017-03-17: qty 5

## 2017-03-17 MED ORDER — HEPARIN SODIUM (PORCINE) 1000 UNIT/ML IJ SOLN
INTRAMUSCULAR | Status: AC
  Filled 2017-03-17: qty 1

## 2017-03-17 MED ORDER — KETOROLAC TROMETHAMINE 15 MG/ML IJ SOLN
15.0000 mg | Freq: Four times a day (QID) | INTRAMUSCULAR | Status: DC
  Administered 2017-03-17 – 2017-03-18 (×4): 15 mg via INTRAVENOUS
  Filled 2017-03-17 (×4): qty 1

## 2017-03-17 MED ORDER — SCOPOLAMINE 1 MG/3DAYS TD PT72
MEDICATED_PATCH | TRANSDERMAL | Status: AC
  Filled 2017-03-17: qty 1

## 2017-03-17 MED ORDER — MORPHINE SULFATE (PF) 4 MG/ML IV SOLN
2.0000 mg | INTRAVENOUS | Status: DC | PRN
  Administered 2017-03-17: 4 mg via INTRAVENOUS
  Filled 2017-03-17: qty 1

## 2017-03-17 MED ORDER — IRBESARTAN 300 MG PO TABS
300.0000 mg | ORAL_TABLET | Freq: Every evening | ORAL | Status: DC
  Administered 2017-03-17: 300 mg via ORAL
  Filled 2017-03-17: qty 1

## 2017-03-17 MED ORDER — HYDROMORPHONE HCL 1 MG/ML IJ SOLN
0.2500 mg | INTRAMUSCULAR | Status: DC | PRN
  Administered 2017-03-17 (×2): 0.25 mg via INTRAVENOUS
  Administered 2017-03-17: 0.5 mg via INTRAVENOUS

## 2017-03-17 MED ORDER — DIPHENHYDRAMINE HCL 50 MG/ML IJ SOLN: 12.5000 mg | Freq: Four times a day (QID) | INTRAMUSCULAR | Status: DC | PRN

## 2017-03-17 MED ORDER — DIPHENHYDRAMINE HCL 12.5 MG/5ML PO ELIX: 12.5000 mg | ORAL_SOLUTION | Freq: Four times a day (QID) | ORAL | Status: DC | PRN

## 2017-03-17 MED ORDER — HYDROMORPHONE HCL 1 MG/ML IJ SOLN
INTRAMUSCULAR | Status: DC | PRN
  Administered 2017-03-17: 0.5 mg via INTRAVENOUS

## 2017-03-17 MED ORDER — BUPIVACAINE-EPINEPHRINE 0.25% -1:200000 IJ SOLN
INTRAMUSCULAR | Status: DC | PRN
  Administered 2017-03-17: 30 mL

## 2017-03-17 MED ORDER — HYDROCHLOROTHIAZIDE 25 MG PO TABS
25.0000 mg | ORAL_TABLET | Freq: Every evening | ORAL | Status: DC
  Administered 2017-03-17: 25 mg via ORAL
  Filled 2017-03-17: qty 1

## 2017-03-17 MED ORDER — PROMETHAZINE HCL 25 MG/ML IJ SOLN: 6.2500 mg | INTRAMUSCULAR | Status: DC | PRN

## 2017-03-17 SURGICAL SUPPLY — 57 items
ADH SKN CLS APL DERMABOND .7 (GAUZE/BANDAGES/DRESSINGS)
APPLICATOR COTTON TIP 6IN STRL (MISCELLANEOUS) ×4 IMPLANT
CATH FOLEY 2WAY SLVR 18FR 30CC (CATHETERS) ×4 IMPLANT
CATH ROBINSON RED A/P 16FR (CATHETERS) ×4 IMPLANT
CATH ROBINSON RED A/P 8FR (CATHETERS) ×4 IMPLANT
CATH TIEMANN FOLEY 18FR 5CC (CATHETERS) ×4 IMPLANT
CHLORAPREP W/TINT 26ML (MISCELLANEOUS) ×4 IMPLANT
CLIP LIGATING HEM O LOK PURPLE (MISCELLANEOUS) ×8 IMPLANT
COVER SURGICAL LIGHT HANDLE (MISCELLANEOUS) ×4 IMPLANT
COVER TIP SHEARS 8 DVNC (MISCELLANEOUS) ×2 IMPLANT
COVER TIP SHEARS 8MM DA VINCI (MISCELLANEOUS) ×2
CUTTER ECHEON FLEX ENDO 45 340 (ENDOMECHANICALS) ×4 IMPLANT
DECANTER SPIKE VIAL GLASS SM (MISCELLANEOUS) ×4 IMPLANT
DERMABOND ADVANCED (GAUZE/BANDAGES/DRESSINGS)
DERMABOND ADVANCED .7 DNX12 (GAUZE/BANDAGES/DRESSINGS) IMPLANT
DRAPE ARM DVNC X/XI (DISPOSABLE) ×8 IMPLANT
DRAPE COLUMN DVNC XI (DISPOSABLE) ×2 IMPLANT
DRAPE DA VINCI XI ARM (DISPOSABLE) ×8
DRAPE DA VINCI XI COLUMN (DISPOSABLE) ×2
DRAPE SURG IRRIG POUCH 19X23 (DRAPES) ×4 IMPLANT
DRSG TEGADERM 4X4.75 (GAUZE/BANDAGES/DRESSINGS) ×4 IMPLANT
ELECT REM PT RETURN 15FT ADLT (MISCELLANEOUS) ×4 IMPLANT
GLOVE BIO SURGEON STRL SZ 6.5 (GLOVE) ×3 IMPLANT
GLOVE BIO SURGEONS STRL SZ 6.5 (GLOVE) ×1
GLOVE BIOGEL M STRL SZ7.5 (GLOVE) ×8 IMPLANT
GOWN STRL REUS W/TWL LRG LVL3 (GOWN DISPOSABLE) ×12 IMPLANT
HOLDER FOLEY CATH W/STRAP (MISCELLANEOUS) ×4 IMPLANT
IRRIG SUCT STRYKERFLOW 2 WTIP (MISCELLANEOUS) ×4
IRRIGATION SUCT STRKRFLW 2 WTP (MISCELLANEOUS) ×2 IMPLANT
IV LACTATED RINGERS 1000ML (IV SOLUTION) ×4 IMPLANT
NDL SAFETY ECLIPSE 18X1.5 (NEEDLE) ×2 IMPLANT
NEEDLE HYPO 18GX1.5 SHARP (NEEDLE) ×4
PACK ROBOT UROLOGY CUSTOM (CUSTOM PROCEDURE TRAY) ×4 IMPLANT
RELOAD STAPLE 45 4.1 GRN THCK (STAPLE) ×2 IMPLANT
SEAL CANN UNIV 5-8 DVNC XI (MISCELLANEOUS) ×8 IMPLANT
SEAL XI 5MM-8MM UNIVERSAL (MISCELLANEOUS) ×8
SOLUTION ELECTROLUBE (MISCELLANEOUS) ×4 IMPLANT
STAPLE RELOAD 45 GRN (STAPLE) ×2 IMPLANT
STAPLE RELOAD 45MM GREEN (STAPLE) ×4
SUT ETHILON 3 0 PS 1 (SUTURE) ×4 IMPLANT
SUT MNCRL 3 0 RB1 (SUTURE) ×2 IMPLANT
SUT MNCRL 3 0 VIOLET RB1 (SUTURE) ×2 IMPLANT
SUT MNCRL AB 4-0 PS2 18 (SUTURE) ×8 IMPLANT
SUT MONOCRYL 3 0 RB1 (SUTURE) ×4
SUT VIC AB 0 CT1 27 (SUTURE) ×4
SUT VIC AB 0 CT1 27XBRD ANTBC (SUTURE) ×2 IMPLANT
SUT VIC AB 0 UR5 27 (SUTURE) ×4 IMPLANT
SUT VIC AB 2-0 SH 27 (SUTURE) ×4
SUT VIC AB 2-0 SH 27X BRD (SUTURE) ×2 IMPLANT
SUT VIC AB 3-0 SH 27 (SUTURE) ×4
SUT VIC AB 3-0 SH 27X BRD (SUTURE) IMPLANT
SUT VICRYL 0 UR6 27IN ABS (SUTURE) ×8 IMPLANT
SYR 27GX1/2 1ML LL SAFETY (SYRINGE) ×4 IMPLANT
TOWEL OR 17X26 10 PK STRL BLUE (TOWEL DISPOSABLE) ×4 IMPLANT
TOWEL OR NON WOVEN STRL DISP B (DISPOSABLE) ×4 IMPLANT
TUBING INSUFFLATION 10FT LAP (TUBING) IMPLANT
WATER STERILE IRR 1500ML POUR (IV SOLUTION) ×8 IMPLANT

## 2017-03-17 NOTE — Progress Notes (Signed)
Patient ID: Phillip Williams, male   DOB: [birth year], 66 y.o.   MRN: [id number]  Post-op note  Subjective: The patient is doing well.  No complaints.  Objective: Vital signs in last 24 hours: Temp:  [97.6 F (36.4 C)-98.6 F (37 C)] 97.7 F (36.5 C) (05/24 1250) Pulse Rate:  [70-85] 70 (05/24 1250) Resp:  [11-16] 13 (05/24 1250) BP: (114-136)/(65-85) 136/75 (05/24 1728) SpO2:  [100 %] 100 % (05/24 1250) Weight:  [108 kg (238 lb)] 108 kg (238 lb) (05/24 0537)  Intake/Output from previous day: No intake/output data recorded. Intake/Output this shift: Total I/O In: 2685 [I.V.:2685] Out: 290 [Urine:100; Drains:40; Blood:150]  Physical Exam:  General: Alert and oriented. Abdomen: Soft, Nondistended. Incisions: Clean and dry.  Lab Results:  Recent Labs  03/17/17 1203  HGB 14.4  HCT 41.4    Assessment/Plan: POD#0   1) Continue to monitor, ambulate, IS   Pryor Curia. MD   LOS: 0 days   Roy Tokarz,LES 03/17/2017, 5:45 PM

## 2017-03-17 NOTE — Interval H&P Note (Signed)
History and Physical Interval Note:  03/17/2017 6:55 AM  Phillip Williams  has presented today for surgery, with the diagnosis of PROSTATE CANCER  The various methods of treatment have been discussed with the patient and family. After consideration of risks, benefits and other options for treatment, the patient has consented to  Procedure(s): XI ROBOTIC ASSISTED LAPAROSCOPIC RADICAL PROSTATECTOMY LEVEL 2 (N/A) PELVIC LYMPHADENECTOMY (Bilateral) as a surgical intervention .  The patient's history has been reviewed, patient examined, no change in status, stable for surgery.  I have reviewed the patient's chart and labs.  Questions were answered to the patient's satisfaction.     Myishia Kasik,LES

## 2017-03-17 NOTE — Progress Notes (Signed)
Assumed care of patient from earlier. Agree with earlier assessment. Patient has no complaints of pain at present. Tolerating clear liquids well. Will continue to monitor. Eulas Post, RN

## 2017-03-17 NOTE — Anesthesia Preprocedure Evaluation (Signed)
Anesthesia Evaluation    Airway Mallampati: II  TM Distance: >3 FB Neck ROM: Full    Dental no notable dental hx.    Pulmonary    Pulmonary exam normal breath sounds clear to auscultation       Cardiovascular hypertension, Normal cardiovascular exam Rhythm:Regular Rate:Normal     Neuro/Psych    GI/Hepatic   Endo/Other  diabetes, Type 2, Oral Hypoglycemic Agents  Renal/GU      Musculoskeletal   Abdominal   Peds  Hematology   Anesthesia Other Findings   Reproductive/Obstetrics                             Anesthesia Physical Anesthesia Plan  ASA: II  Anesthesia Plan: General   Post-op Pain Management:    Induction: Intravenous  Airway Management Planned: Oral ETT  Additional Equipment:   Intra-op Plan:   Post-operative Plan: Extubation in OR  Informed Consent: I have reviewed the patients History and Physical, chart, labs and discussed the procedure including the risks, benefits and alternatives for the proposed anesthesia with the patient or authorized representative who has indicated his/her understanding and acceptance.   Dental advisory given  Plan Discussed with: CRNA  Anesthesia Plan Comments:         Anesthesia Quick Evaluation

## 2017-03-17 NOTE — Transfer of Care (Signed)
Immediate Anesthesia Transfer of Care Note  Patient: Phillip Williams  Procedure(s) Performed: Procedure(s): XI ROBOTIC ASSISTED LAPAROSCOPIC RADICAL PROSTATECTOMY LEVEL 2 (N/A) PELVIC LYMPHADENECTOMY (Bilateral)  Patient Location: PACU  Anesthesia Type:General  Level of Consciousness: awake, alert , oriented and patient cooperative  Airway & Oxygen Therapy: Patient Spontanous Breathing and Patient connected to face mask oxygen  Post-op Assessment: Report given to RN, Post -op Vital signs reviewed and stable and Patient moving all extremities  Post vital signs: Reviewed and stable  Last Vitals:  Vitals:   03/17/17 0517 03/17/17 1149  BP: 123/85   Pulse: 80 85  Resp: 16 16  Temp: 37 C     Last Pain:  Vitals:   03/17/17 0517  TempSrc: Oral      Patients Stated Pain Goal: 4 (47/07/61 5183)  Complications: No apparent anesthesia complications

## 2017-03-17 NOTE — Op Note (Signed)
Preoperative diagnosis: Clinically localized adenocarcinoma of the prostate (clinical stage T1c N0 M0)  Postoperative diagnosis: Clinically localized adenocarcinoma of the prostate (clinical stage T1c N0 M0)  Procedure:  1. Robotic assisted laparoscopic radical prostatectomy (bilateral nerve sparing) 2. Bilateral robotic assisted laparoscopic pelvic lymphadenectomy  Surgeon: Pryor Curia. M.D.  Assistant: Lorayne Bender, MD  An assistant was required for this surgical procedure.  The duties of the assistant included but were not limited to suctioning, passing suture, camera manipulation, retraction. This procedure would not be able to be performed without an Environmental consultant.  Anesthesia: General  Complications: None  EBL: 150 mL  IVF:  2300 mL crystalloid  Specimens: 1. Prostate and seminal vesicles 2. Right pelvic lymph nodes 3. Left pelvic lymph nodes  Disposition of specimens: Pathology  Drains: 1. 20 Fr coude catheter 2. # 19 Blake pelvic drain  Indication: Phillip Williams is a 66 y.o. year old patient with clinically localized prostate cancer.  After a thorough review of the management options for treatment of prostate cancer, he elected to proceed with surgical therapy and the above procedure(s).  We have discussed the potential benefits and risks of the procedure, side effects of the proposed treatment, the likelihood of the patient achieving the goals of the procedure, and any potential problems that might occur during the procedure or recuperation. Informed consent has been obtained.  Description of procedure:  The patient was taken to the operating room and a general anesthetic was administered. He was given preoperative antibiotics, placed in the dorsal lithotomy position, and prepped and draped in the usual sterile fashion. Next a preoperative timeout was performed. A urethral catheter was placed into the bladder and a site was selected near the umbilicus for  placement of the camera port. This was placed using a standard open Hassan technique which allowed entry into the peritoneal cavity under direct vision and without difficulty. An 8 mm robotic port was placed and a pneumoperitoneum established. The camera was then used to inspect the abdomen and there was no evidence of any intra-abdominal injuries or other abnormalities. The remaining abdominal ports were then placed. 8 mm robotic ports were placed in the right lower quadrant, left lower quadrant, and far left lateral abdominal wall. A 5 mm port was placed in the right upper quadrant and a 12 mm port was placed in the right lateral abdominal wall for laparoscopic assistance. All ports were placed under direct vision without difficulty. The surgical cart was then docked.   Utilizing the cautery scissors, the bladder was reflected posteriorly allowing entry into the space of Retzius and identification of the endopelvic fascia and prostate. The periprostatic fat was then removed from the prostate allowing full exposure of the endopelvic fascia. The endopelvic fascia was then incised from the apex back to the base of the prostate bilaterally and the underlying levator muscle fibers were swept laterally off the prostate thereby isolating the dorsal venous complex. The dorsal vein was then stapled and divided with a 45 mm Flex Echelon stapler. Attention then turned to the bladder neck which was divided anteriorly thereby allowing entry into the bladder and exposure of the urethral catheter. The catheter balloon was deflated and the catheter was brought into the operative field and used to retract the prostate anteriorly. The posterior bladder neck was then examined and was divided allowing further dissection between the bladder and prostate posteriorly until the vasa deferentia and seminal vessels were identified. The vasa deferentia were isolated, divided, and lifted anteriorly. The  seminal vesicles were dissected down  to their tips with care to control the seminal vascular arterial blood supply. These structures were then lifted anteriorly and the space between Denonvillier's fascia and the anterior rectum was developed with a combination of sharp and blunt dissection. This isolated the vascular pedicles of the prostate.  The lateral prostatic fascia was then sharply incised allowing release of the neurovascular bundles bilaterally. The vascular pedicles of the prostate were then ligated with Weck clips between the prostate and neurovascular bundles and divided with sharp cold scissor dissection resulting in neurovascular bundle preservation. The neurovascular bundles were then separated off the apex of the prostate and urethra bilaterally.  The urethra was then sharply transected allowing the prostate specimen to be disarticulated. The pelvis was copiously irrigated and hemostasis was ensured. There was no evidence for rectal injury.  Attention then turned to the right pelvic sidewall. The fibrofatty tissue between the external iliac vein, confluence of the iliac vessels, hypogastric artery, and Cooper's ligament was dissected free from the pelvic sidewall with care to preserve the obturator nerve. Weck clips were used for lymphostasis and hemostasis. An identical procedure was performed on the contralateral side and the lymphatic packets were removed for permanent pathologic analysis.  Attention then turned to the urethral anastomosis. A 2-0 Vicryl slip knot was placed between Denonvillier's fascia, the posterior bladder neck, and the posterior urethra to reapproximate these structures. A double-armed 3-0 Monocryl suture was then used to perform a 360 running tension-free anastomosis between the bladder neck and urethra. A new urethral catheter was then placed into the bladder and irrigated. There were no blood clots within the bladder and the anastomosis appeared to be watertight. A #19 Blake drain was then brought  through the left lateral 8 mm port site and positioned appropriately within the pelvis. It was secured to the skin with a nylon suture. The surgical cart was then undocked. The right lateral 12 mm port site was closed at the fascial level with a 0 Vicryl suture placed laparoscopically. All remaining ports were then removed under direct vision. The prostate specimen was removed intact within the Endopouch retrieval bag via the periumbilical camera port site. This fascial opening was closed with two running 0 Vicryl sutures. 0.25% Marcaine was then injected into all port sites and all incisions were reapproximated at the skin level with 4-0 Monocryl subcuticular sutures and Liquiband. The patient appeared to tolerate the procedure well and without complications. The patient was able to be extubated and transferred to the recovery unit in satisfactory condition.   Pryor Curia MD

## 2017-03-17 NOTE — Discharge Instructions (Signed)

## 2017-03-17 NOTE — Anesthesia Procedure Notes (Signed)
Procedure Name: Intubation Date/Time: 03/17/2017 7:25 AM Performed by: Carleene Cooper A Pre-anesthesia Checklist: Patient identified, Emergency Drugs available, Suction available, Patient being monitored and Timeout performed Patient Re-evaluated:Patient Re-evaluated prior to inductionOxygen Delivery Method: Circle system utilized Preoxygenation: Pre-oxygenation with 100% oxygen Intubation Type: IV induction Ventilation: Mask ventilation without difficulty Laryngoscope Size: Mac and 4 Grade View: Grade I Tube type: Oral Tube size: 7.5 mm Number of attempts: 1 Airway Equipment and Method: Stylet Placement Confirmation: ETT inserted through vocal cords under direct vision,  positive ETCO2 and breath sounds checked- equal and bilateral Secured at: 22 cm Tube secured with: Tape Dental Injury: Teeth and Oropharynx as per pre-operative assessment

## 2017-03-17 NOTE — Anesthesia Postprocedure Evaluation (Signed)
Anesthesia Post Note  Patient: Phillip Williams  Procedure(s) Performed: Procedure(s) (LRB): XI ROBOTIC ASSISTED LAPAROSCOPIC RADICAL PROSTATECTOMY LEVEL 2 (N/A) PELVIC LYMPHADENECTOMY (Bilateral)  Patient location during evaluation: PACU Anesthesia Type: General Level of consciousness: awake and alert Pain management: pain level controlled Vital Signs Assessment: post-procedure vital signs reviewed and stable Respiratory status: spontaneous breathing, nonlabored ventilation and respiratory function stable Cardiovascular status: blood pressure returned to baseline and stable Postop Assessment: no signs of nausea or vomiting Anesthetic complications: no       Last Vitals:  Vitals:   03/17/17 1215 03/17/17 1230  BP: 114/75 114/75  Pulse: 82 80  Resp: 12 11  Temp:  36.4 C    Last Pain:  Vitals:   03/17/17 1230  TempSrc:   PainSc: Asleep                 Lynda Rainwater

## 2017-03-18 ENCOUNTER — Encounter (HOSPITAL_COMMUNITY): Payer: Self-pay | Admitting: Urology

## 2017-03-18 DIAGNOSIS — C61 Malignant neoplasm of prostate: Secondary | ICD-10-CM | POA: Diagnosis not present

## 2017-03-18 LAB — GLUCOSE, CAPILLARY
GLUCOSE-CAPILLARY: 102 mg/dL — AB (ref 65–99)
GLUCOSE-CAPILLARY: 133 mg/dL — AB (ref 65–99)
Glucose-Capillary: 140 mg/dL — ABNORMAL HIGH (ref 65–99)
Glucose-Capillary: 182 mg/dL — ABNORMAL HIGH (ref 65–99)
Glucose-Capillary: 76 mg/dL (ref 65–99)

## 2017-03-18 LAB — HEMOGLOBIN AND HEMATOCRIT, BLOOD
HCT: 41.3 % (ref 39.0–52.0)
HEMOGLOBIN: 14.3 g/dL (ref 13.0–17.0)

## 2017-03-18 MED ORDER — BISACODYL 10 MG RE SUPP
10.0000 mg | Freq: Once | RECTAL | Status: AC
  Administered 2017-03-18: 10 mg via RECTAL
  Filled 2017-03-18: qty 1

## 2017-03-18 NOTE — Discharge Summary (Signed)
  Date of admission: 03/17/2017  Date of discharge: 03/18/2017  Admission diagnosis: Prostate Cancer  Discharge diagnosis: Prostate Cancer  History and Physical: For full details, please see admission history and physical. Briefly, Phillip Williams is a 66 y.o. gentleman with localized prostate cancer.  After discussing management/treatment options, he elected to proceed with surgical treatment.  Hospital Course: Phillip Williams was taken to the operating room on 03/17/2017 and underwent a robotic assisted laparoscopic radical prostatectomy. He tolerated this procedure well and without complications. Postoperatively, he was able to be transferred to a regular hospital room following recovery from anesthesia.  He was able to begin ambulating the night of surgery. He remained hemodynamically stable overnight.  He had excellent urine output with appropriately minimal output from his pelvic drain and his pelvic drain was removed on POD #1.  He was transitioned to oral pain medication, tolerated a clear liquid diet, and had met all discharge criteria and was able to be discharged home later on POD#1.  Laboratory values:  Recent Labs  03/17/17 1203 03/18/17 0452  HGB 14.4 14.3  HCT 41.4 41.3    Disposition: Home  Discharge instruction: He was instructed to be ambulatory but to refrain from heavy lifting, strenuous activity, or driving. He was instructed on urethral catheter care.  Discharge medications:  Allergies as of 03/18/2017      Reactions   Pork-derived Products Other (See Comments)   Patient preference      Medication List    STOP taking these medications   aspirin EC 81 MG tablet   ibuprofen 200 MG tablet Commonly known as:  ADVIL,MOTRIN     TAKE these medications   metFORMIN 500 MG tablet Commonly known as:  GLUCOPHAGE Take 500 mg by mouth 2 (two) times daily with a meal.   Olmesartan-Amlodipine-HCTZ 40-10-25 MG Tabs Take 1 capsule by mouth every evening.   PAZEO  0.7 % Soln Generic drug:  Olopatadine HCl Place 1 drop into both eyes at bedtime.      He has been provided a prescription for hydrocodone prn and Bactrim to begin prior to f/u appt.   Followup: He will followup in 1 week for catheter removal and to discuss his surgical pathology results.

## 2017-03-18 NOTE — Progress Notes (Signed)
Urology Progress Note  1 Day Post-Op  Subjective: The patient is doing well.  No nausea or vomiting. Pain is adequately controlled.   Objective: Vital signs in last 24 hours: Temp:  [97.6 F (36.4 C)-99.6 F (37.6 C)] 99.6 F (37.6 C) (05/25 0601) Pulse Rate:  [70-89] 80 (05/25 0601) Resp:  [11-18] 18 (05/25 0601) BP: (114-146)/(65-87) 123/71 (05/25 0601) SpO2:  [99 %-100 %] 99 % (05/25 0601)  Intake/Output from previous day: 05/24 0701 - 05/25 0700 In: 4985 [I.V.:4935; IV Piggyback:50] Out: 3115 [Urine:2825; Drains:140; Blood:150] Intake/Output this shift: No intake/output data recorded.  Physical Exam:  General: Alert and oriented. CV: RRR Lungs: Clear bilaterally. GI: Soft, Nondistended. Incisions: Clean, dry, and intact Urine: Clear Extremities: Nontender, no erythema, no edema.  Lab Results:  Recent Labs  03/17/17 1203 03/18/17 0452  HGB 14.4 14.3  HCT 41.4 41.3      Assessment/Plan: POD# 1 s/p robotic prostatectomy.  1) SL IVF 2) Ambulate, Incentive spirometry 3) Transition to oral pain medication 4) Dulcolax suppository 5) D/C pelvic drain 6) Plan for likely discharge later today   LOS: 0 days   Phillip Williams R 03/18/2017, 7:29 AM

## 2017-06-28 ENCOUNTER — Other Ambulatory Visit (HOSPITAL_COMMUNITY): Payer: Self-pay | Admitting: Urology

## 2017-06-28 DIAGNOSIS — C61 Malignant neoplasm of prostate: Secondary | ICD-10-CM

## 2017-07-12 ENCOUNTER — Telehealth: Payer: Self-pay | Admitting: Medical Oncology

## 2017-07-12 ENCOUNTER — Encounter: Payer: Self-pay | Admitting: Medical Oncology

## 2017-07-12 NOTE — Telephone Encounter (Signed)
I called pt to introduce myself as the Prostate Nurse Navigator and the Coordinator of the Prostate [place].  1. I confirmed with the patient he is aware of his referral to the clinic 07/19/17 arriving at 12:30 pm.  2. I discussed the format of the clinic and the physicians he will be seeing that day.  3. I discussed where the clinic is located and how to contact me.  4. I confirmed his address and informed him I would be mailing a packet of information and forms to be completed. I asked him to bring them with him the day of his appointment.   He voiced understanding of the above. I asked him to call me if he has any questions or concerns regarding his appointments or the forms he needs to complete.

## 2017-07-12 NOTE — Telephone Encounter (Signed)
Left a message requesting a return call to discuss referral to Prostate [facility].

## 2017-07-14 ENCOUNTER — Encounter (HOSPITAL_COMMUNITY): Payer: Self-pay

## 2017-07-14 ENCOUNTER — Ambulatory Visit (HOSPITAL_COMMUNITY)
Admission: RE | Admit: 2017-07-14 | Discharge: 2017-07-14 | Disposition: A | Discharge status: Home / Self Care | Payer: BLUE CROSS/BLUE SHIELD | Source: Ambulatory Visit | Attending: Urology | Admitting: Urology

## 2017-07-14 DIAGNOSIS — C61 Malignant neoplasm of prostate: Secondary | ICD-10-CM

## 2017-07-15 ENCOUNTER — Telehealth: Payer: Self-pay | Admitting: Medical Oncology

## 2017-07-15 NOTE — Telephone Encounter (Signed)
Left a message requesting a return call regarding appointment for [facility]. His PET scan had to be rescheduled to 07/22/17 so we need to reschedule PMDC to 10/12 arriving at 12 pm.

## 2017-07-18 ENCOUNTER — Telehealth: Payer: Self-pay | Admitting: Medical Oncology

## 2017-07-18 ENCOUNTER — Encounter: Payer: Self-pay | Admitting: Medical Oncology

## 2017-07-18 NOTE — Telephone Encounter (Signed)
Left message requesting a return call to confirm his appointment for Prostate [facility] has been rescheduled due to the PET scan delay.

## 2017-07-19 ENCOUNTER — Ambulatory Visit: Payer: BLUE CROSS/BLUE SHIELD | Admitting: Oncology

## 2017-07-19 ENCOUNTER — Ambulatory Visit: Payer: BLUE CROSS/BLUE SHIELD | Admitting: Radiation Oncology

## 2017-07-19 NOTE — Telephone Encounter (Signed)
Spoke with Phillip Williams to verify he received my message regarding rescheduling of Prostate [place] appointment from 9/25 to 10/12. His PET scan had to be rescheduled to 9/289/18 and results would not be available for clinic. He voiced understanding and asked if I could write a letter stating the above for his employer. I informed him that I would be happy to and he could pick it up at the front desk here at [facility]. He voiced understanding.

## 2017-07-22 ENCOUNTER — Ambulatory Visit (HOSPITAL_COMMUNITY)
Admission: RE | Admit: 2017-07-22 | Discharge: 2017-07-22 | Disposition: A | Discharge status: Home / Self Care | Payer: BLUE CROSS/BLUE SHIELD | Source: Ambulatory Visit | Attending: Urology | Admitting: Urology

## 2017-07-22 DIAGNOSIS — C61 Malignant neoplasm of prostate: Secondary | ICD-10-CM | POA: Insufficient documentation

## 2017-07-22 DIAGNOSIS — R918 Other nonspecific abnormal finding of lung field: Secondary | ICD-10-CM | POA: Insufficient documentation

## 2017-07-22 MED ORDER — AXUMIN (FLUCICLOVINE F 18) INJECTION
10.6000 | Freq: Once | INTRAVENOUS | Status: AC
  Administered 2017-07-22: 10.6 via INTRAVENOUS

## 2017-07-25 ENCOUNTER — Telehealth: Payer: Self-pay | Admitting: Medical Oncology

## 2017-07-25 ENCOUNTER — Encounter: Payer: Self-pay | Admitting: Medical Oncology

## 2017-07-25 NOTE — Telephone Encounter (Signed)
Phillip Williams called stating the letter I typed regarding rescheduling of PMDC has the incorrect date. I retyped the letter and left at front desk.

## 2017-08-02 NOTE — Progress Notes (Signed)
GU Location of Tumor / Histology: Locally advanced, lymph node positive prostate cancer  If Prostate Cancer, Gleason Score is (4 + 3) and PSA is (15.2)  Phillip Williams is s/p a UNS RAL radical prostatectomy and BPLND on 03/17/2017. 3 of 19 lymph nodes were positive.    Past/Anticipated interventions by urology, if any: biopsy, referral to [facility] (pt cancelled), prostatectomy, PET scan, referral to [facility]  Past/Anticipated interventions by medical oncology, if any: no  Weight changes, if any: no  Bowel/Bladder complaints, if any: Stress incontinence s/p prostatectomy. Wears one pad per day.    Nausea/Vomiting, if any: no  Pain issues, if any:    SAFETY ISSUES:  Prior radiation?   Pacemaker/ICD?   Possible current pregnancy? no  Is the patient on methotrexate?   Current Complaints / other details:  66 year old male. 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

## 2017-08-04 ENCOUNTER — Telehealth: Payer: Self-pay | Admitting: Medical Oncology

## 2017-08-04 NOTE — Telephone Encounter (Signed)
Left a message to remind patient of appointment for Prostate [place] 10/12 arriving 7:45-8:00. I reviewed location, valet parking and registration process. I reminded him to bring his completed medical forms.

## 2017-08-04 NOTE — Progress Notes (Signed)
Radiation Oncology         [phone] ________________________________  Multidisciplinary Prostate Cancer Clinic  Initial Radiation Oncology Consultation  Name: Phillip Williams MRN: [id number]  Date: 08/05/2017  DOB: 02/09/1951  BP:ZWCHENI, Christean Grief, MD  Raynelle Bring, MD   REFERRING PHYSICIAN: Raynelle Bring, MD  DIAGNOSIS: 66 y.o. gentleman with stage pT3aN1M0 adenocarcinoma of the prostate with a Gleason's score of 4+3 and a pretreatment PSA of 15.2, s/p UNS RALP with BPLND 03/17/17 with 3 positive lymph nodes and post treatment PSA of 0.4.    ICD-10-CM   1. Prostate cancer ([place]) C61     HISTORY OF PRESENT ILLNESS::Phillip Williams is a 66 y.o. gentleman with stage pT3aN1M0 adenocarcinoma of the prostate with a Gleason's score of 4+3 and a pretreatment PSA of 15.2, s/p UNS RALP with BPLND 03/17/17 with lymph node positive disease and initial post treatment PSA of 0.36.  Surgical margins were negative but 3/16 lymph nodes sampled were positive.  Repeat PSA 07/04/17 was 0.40.  He has recovered relatively well from his surgery, now requiring only 1ppd for mild residual SUI. He had an Axumin PET scan on 07/22/17 which was negative for evidence of nodal metastasis, soft tissue metastasis or osseous metastasis. There was an elongated pulmonary nodule in the right upper lobe measuring up to 2 cm with smooth margins, a smaller pulmonary nodule in the superior segment of the right lower lobe measuring 7 mm as well as a 4 mm pulmonary nodule along the inferior aspect of the right oblique fissure- all of which do not show radiotracer accumulation suggesting these are not related to his prostate cancer. Additionally, there was no evidence of abnormal radiotracer uptake within the left eighth rib to correlate with the abnormal findings on his staging bone scan.  The patient reviewed the surgical pathology and PSA results with his urologist and he has kindly been referred today to the multidisciplinary  prostate cancer clinic for presentation of pathology and radiology studies in our conference for discussion of potential salvage radiation treatment options and clinical evaluation.   PREVIOUS RADIATION THERAPY: No  PAST MEDICAL HISTORY:  has a past medical history of Diabetes mellitus without complication ([place]); Hypertension; and Prostate cancer ([place]).    PAST SURGICAL HISTORY: Past Surgical History:  Procedure Laterality Date  . LYMPHADENECTOMY Bilateral 03/17/2017   Procedure: PELVIC LYMPHADENECTOMY;  Surgeon: Raynelle Bring, MD;  Location: [facility];  Service: Urology;  Laterality: Bilateral;  . PROSTATE BIOPSY    . ROBOT ASSISTED LAPAROSCOPIC RADICAL PROSTATECTOMY N/A 03/17/2017   Procedure: XI ROBOTIC ASSISTED LAPAROSCOPIC RADICAL PROSTATECTOMY LEVEL 2;  Surgeon: Raynelle Bring, MD;  Location: [facility];  Service: Urology;  Laterality: N/A;    FAMILY HISTORY: family history is not on file.  SOCIAL HISTORY:  reports that he has never smoked. He has never used smokeless tobacco. He reports that he does not drink alcohol or use drugs.  ALLERGIES: Pork-derived products  MEDICATIONS:  Current Outpatient Prescriptions  Medication Sig Dispense Refill  . metFORMIN (GLUCOPHAGE) 500 MG tablet Take 500 mg by mouth 2 (two) times daily with a meal.    . Olmesartan-Amlodipine-HCTZ 40-10-25 MG TABS Take 1 capsule by mouth every evening.  2  . PAZEO 0.7 % SOLN Place 1 drop into both eyes at bedtime.  1   No current facility-administered medications for this encounter.     REVIEW OF SYSTEMS:  On review of systems, the patient reports that he is doing well overall. He denies any chest pain, shortness of  breath, cough, fevers, chills, night sweats, unintended weight changes. He denies any bowel disturbances, and denies abdominal pain, nausea or vomiting. He denies any new musculoskeletal or joint aches or pains. He is pleased with his progress in regaining bladder continence and is currently only  requiring the use of 1ppd for occasional SUI.  He has mild ED.  A complete review of systems is obtained and is otherwise negative.  PHYSICAL EXAM:  Wt Readings from Last 3 Encounters:  08/05/17 227 lb 9.6 oz (103.2 kg)  03/17/17 238 lb (108 kg)  03/07/17 238 lb 6 oz (108.1 kg)   Temp Readings from Last 3 Encounters:  03/18/17 99.6 F (37.6 C) (Oral)  03/07/17 98 F (36.7 C) (Oral)  02/03/15 98.6 F (37 C) (Oral)   BP Readings from Last 3 Encounters:  08/05/17 114/77  03/18/17 123/71  03/07/17 105/73   Pulse Readings from Last 3 Encounters:  08/05/17 77  03/18/17 80  03/07/17 87   Pain Assessment Pain Score: 0-No pain/10  In general this is a well appearing [place] male in no acute distress. He is alert and oriented x4 and appropriate throughout the examination. HEENT reveals that the patient is normocephalic, atraumatic. EOMs are intact. PERRLA. Skin is intact without any evidence of gross lesions. Cardiovascular exam reveals a regular rate and rhythm, no clicks rubs or murmurs are auscultated. Chest is clear to auscultation bilaterally. Lymphatic assessment is performed and does not reveal any adenopathy in the cervical, supraclavicular, axillary, or inguinal chains. Abdomen has active bowel sounds in all quadrants and is intact. The abdomen is soft, non tender, non distended. Lower extremities are negative for pretibial pitting edema, deep calf tenderness, cyanosis or clubbing.  KPS = 100  100 - Normal; no complaints; no evidence of disease. 90   - Able to carry on normal activity; minor signs or symptoms of disease. 80   - Normal activity with effort; some signs or symptoms of disease. 84   - Cares for self; unable to carry on normal activity or to do active work. 60   - Requires occasional assistance, but is able to care for most of his personal needs. 50   - Requires considerable assistance and frequent medical care. 41   - Disabled; requires special care and  assistance. 44   - Severely disabled; hospital admission is indicated although death not imminent. 65   - Very sick; hospital admission necessary; active supportive treatment necessary. 10   - Moribund; fatal processes progressing rapidly. 0     - Dead  Karnofsky DA, Abelmann Harper, Craver LS and Burchenal [facility] [phone]) The use of the nitrogen mustards in the palliative treatment of carcinoma: with particular reference to bronchogenic carcinoma Cancer 1 [phone]   LABORATORY DATA:  Lab Results  Component Value Date   WBC 7.2 03/07/2017   HGB 14.3 03/18/2017   HCT 41.3 03/18/2017   MCV 83.4 03/07/2017   PLT 241 03/07/2017   Lab Results  Component Value Date   NA 137 03/07/2017   K 4.4 03/07/2017   CL 99 (L) 03/07/2017   CO2 29 03/07/2017   No results found for: ALT, AST, GGT, ALKPHOS, BILITOT   RADIOGRAPHY: Nm Pet (axumin) Skull Base To Mid Thigh  Result Date: 07/22/2017 CLINICAL DATA:  Prostate carcinoma (Gleason 4 + 4 equals 8. PSA equal 15.2 EXAM: NUCLEAR MEDICINE PET SKULL BASE TO THIGH TECHNIQUE: 10.6 mCi F-18 Fluciclovine was injected intravenously. Full-ring PET imaging was performed from the skull base to  thigh after the radiotracer. CT data was obtained and used for attenuation correction and anatomic localization. COMPARISON:  Bone scan 01/31/2017, CT 01/31/2017 FINDINGS: NECK No radiotracer activity in neck lymph nodes. CHEST No radiotracer accumulation within mediastinal or hilar lymph nodes. Within the RIGHT lung apex there is a elongated smooth nodule measuring 2 cm (image 58, series 4). This does not have radiotracer accumulation. Smaller nodule in the superior segment of the RIGHT lower lobe measuring 7 mm (image 66, series 4. Small nodule along the inferior aspect the RIGHT oblique fissure measures 4 mm. These nodules do not accumulate radiotracer. ABDOMEN/PELVIS No focal radiotracer activity within the prostate bed. No radiotracer activity within pelvic lymph nodes. No  radiotracer activity in periaortic lymph nodes. No enlarged lymph nodes identified. There is mild activity associated bilateral inguinal lymph nodes which is favored benign Physiologic activity noted within the liver and pancreas. Bladder wall mildly thickened. Atherosclerotic calcification of the aorta. SKELETON No focal radiotracer accumulation within the skeleton to localize prostate cancer metastasis. Particular attention directed to the LEFT eighth rib. IMPRESSION: 1. No evidence of prostate cancer nodal metastasis, soft tissue metastasis, or bone metastasis. 2. Elongated nodule in the RIGHT upper lobe measuring up to 2 cm with smooth margins and no radiotracer accumulation is favored benign. Recommend chest CT with contrast to evaluate for a vascular anomaly. 3. Additional smaller RIGHT lung pulmonary nodules are favored benign. Electronically Signed   By: Suzy Bouchard M.D.   On: 07/22/2017 16:13      IMPRESSION/PLAN: 66 y.o.  gentleman with stage pT3aN1M0 adenocarcinoma of the prostate with a Gleason's score of 4+3 and a pretreatment PSA of 15.2, s/p UNS RALP with BPLND 03/17/17 with lymph node positive disease and post treatment PSA of 0.40.  Today we reviewed the findings and workup thus far.  We discussed the natural history of prostate cancer.  We reviewed the the implications of positive margins, extracapsular extension, and seminal vesicle involvement on the risk of prostate cancer recurrence. We reviewed some of the evidence suggesting an advantage for patients who undergo salvage radiotherapy in terms of disease control and overall survival.  We discussed radiation treatment directed to the prostatic fossa with regard to the logistics and delivery of external beam radiation treatment.  We also detailed the role of ADT in the treatment of high risk prostate cancer and outlined the associated side effects that could be expected with this therapy. We recommend proceeding with a course of 38-40  daily treatments to the pelvis and prostatic fossa delivered over a period of 7.5-8 weeks in combination with LT-ADT.  At the conclusion of our conversation today, the patient elects to proceed with salvage radiotherapy to the pelvis and prostatic fossa.  He will follow up with Dr. Alinda Money in the near future to begin ADT and we will move forward with coordinating [facility] for early January 2019.  This will allow for additional time to continue working towards regaining his bladder continence.  We will share our findings with Dr. Alinda Money and move forward with treatment planning in anticipation of beginning salvage radiotherapy in January 2019.     We enjoyed meeting with him today, and will look forward to participating in the care of this very nice gentleman.   We spent 60 minutes face to face with the patient and more than 50% of that time was spent in counseling and/or coordination of care.   Nicholos Johns, PA-C    Tyler Pita, MD  [place]  Radiation Oncology Direct Dial: [phone]  Fax: [phone] [email or web address]  Skype  LinkedIn

## 2017-08-05 ENCOUNTER — Encounter: Payer: Self-pay | Admitting: General Practice

## 2017-08-05 ENCOUNTER — Ambulatory Visit
Admission: RE | Admit: 2017-08-05 | Discharge: 2017-08-05 | Disposition: A | Discharge status: Home / Self Care | Payer: BLUE CROSS/BLUE SHIELD | Source: Ambulatory Visit | Attending: Radiation Oncology | Admitting: Radiation Oncology

## 2017-08-05 ENCOUNTER — Ambulatory Visit (HOSPITAL_BASED_OUTPATIENT_CLINIC_OR_DEPARTMENT_OTHER): Payer: BLUE CROSS/BLUE SHIELD | Admitting: Oncology

## 2017-08-05 ENCOUNTER — Encounter: Payer: Self-pay | Admitting: Medical Oncology

## 2017-08-05 VITALS — BP 114/77 | HR 77 | Resp 18 | Ht 68.0 in | Wt 227.6 lb

## 2017-08-05 DIAGNOSIS — R918 Other nonspecific abnormal finding of lung field: Secondary | ICD-10-CM | POA: Insufficient documentation

## 2017-08-05 DIAGNOSIS — Z9889 Other specified postprocedural states: Secondary | ICD-10-CM | POA: Insufficient documentation

## 2017-08-05 DIAGNOSIS — Z91018 Allergy to other foods: Secondary | ICD-10-CM | POA: Insufficient documentation

## 2017-08-05 DIAGNOSIS — Z7984 Long term (current) use of oral hypoglycemic drugs: Secondary | ICD-10-CM | POA: Diagnosis not present

## 2017-08-05 DIAGNOSIS — C775 Secondary and unspecified malignant neoplasm of intrapelvic lymph nodes: Secondary | ICD-10-CM | POA: Diagnosis not present

## 2017-08-05 DIAGNOSIS — E119 Type 2 diabetes mellitus without complications: Secondary | ICD-10-CM | POA: Insufficient documentation

## 2017-08-05 DIAGNOSIS — I10 Essential (primary) hypertension: Secondary | ICD-10-CM | POA: Insufficient documentation

## 2017-08-05 DIAGNOSIS — Z79899 Other long term (current) drug therapy: Secondary | ICD-10-CM | POA: Insufficient documentation

## 2017-08-05 DIAGNOSIS — C61 Malignant neoplasm of prostate: Secondary | ICD-10-CM | POA: Diagnosis not present

## 2017-08-05 DIAGNOSIS — R911 Solitary pulmonary nodule: Secondary | ICD-10-CM | POA: Diagnosis not present

## 2017-08-05 NOTE — Progress Notes (Signed)
[place] Psychosocial Distress Screening Spiritual Care  Met with Argie in [facility] to introduce [facility] team/resources, reviewing distress screen per protocol.  The patient scored a 0 on the Psychosocial Distress Thermometer which indicates minimal distress. Also assessed for distress and other psychosocial needs.   ONCBCN DISTRESS SCREENING 08/05/2017  Screening Type Initial Screening  Distress experienced in past week (1-10) 0  Referral to support programs Yes    Mr Gervin grew up in [place] and has been singing in church choirs since 1956; singing lines from [place] is part of how he understands, processes, and blesses the world.  Per pt, he has no distress because he knows that God is good and that everything he has is from God.  His faith and theology are the heart of his system of meaning-making (hope, comfort, strength, purpose, etc).  Sunil used our encounter well to share and process his story and sense of purpose. He plans to make use of Lasana and team.  Follow up needed: No.  Per pt, no other needs at this time.  He plans to call contact team as his journey unfolds.   [place], [place], [facility] Pager [phone] Voicemail [phone]

## 2017-08-05 NOTE — Consult Note (Signed)
Multi-Disciplinary Clinic     07/19/2017   --------------------------------------------------------------------------------   Phillip Williams  MRN: [id number]  PRIMARY CARE:  Nolene Ebbs, MD  DOB: 1951-07-16, 67 year old Male  REFERRING:  Nolene Ebbs, MD  SSN: -[id number]  PROVIDER:  Irine Seal, M.D.    TREATING:  Raynelle Bring, M.D.    LOCATION:  [facility] [phone]   --------------------------------------------------------------------------------   CC/HPI: CC: Prostate Cancer    Location of consult: [place] is seen today in the multidisciplinary prostate cancer clinic for further discussion regarding treatment options of this lymph node-positive prostate cancer with a persistently elevated PSA. He follows up today after his PSA was rechecked earlier this month and found to be 0.4. He remains asymptomatic. He currently has very mild stress incontinence utilizing one pad per day although will not leak unless with increased abdominal pressure or with coughing/sneezing.     ALLERGIES: None   MEDICATIONS: Bactrim Ds  Hydrocodone-Acetaminophen 5 mg-325 mg tablet 1-2 tablet PO Q 6 H prn  Metformin Hcl  Aspirin Ec 81 mg tablet, delayed release  Tribenzor     GU PSH: Complex Uroflow - 01/10/2017 Laparoscopy; Lymphadenectomy - 03/17/2017 Locm 300-399Mg /Ml Iodine,1Ml - 01/31/2017 Prostate Needle Biopsy - 01/10/2017 Robotic Radical Prostatectomy - 03/17/2017    NON-GU PSH: Surgical Pathology, Gross And Microscopic Examination For Prostate Needle - 01/10/2017    GU PMH: Stress Incontinence - 04/15/2017, - 03/15/2017 Low back pain - 03/08/2017 Prostate Cancer, He has T1c N0 M(?) Gleason 7(4+3) intermediate to high risk prostate cancer with 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 He is most interested in radiation therapy with adjuvant ADT and I will get him set up to see Rad  Onc and return for gold seeds and firmagon. I am going to order left rib films to make sure the bone scan findings are not significant. - 02/02/2017      PMH Notes:   1) Prostate cancer: He is s/p a UNS RAL radical prostatectomy and BPLND on 03/17/17.   Diagnosis: pT3a N1 Mx, Gleason 4+3=7 adenocarcinoma with negative surgical margins (3/19 LN positive)  Pretreatment PSA: 15.2  Pretreatment SHIM: 10   NON-GU PMH: Arthritis Diabetes Type 2 Hypertension    FAMILY HISTORY: None   SOCIAL HISTORY: Marital Status: Married Preferred Language: English; Ethnicity: Not Hispanic Or Latino; Race: Black or African American Current Smoking Status: Patient has never smoked.   Tobacco Use Assessment Completed: Used Tobacco in last 30 days?     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    REVIEW OF SYSTEMS:    GU Review Male:   Patient denies frequent urination, hard to postpone urination, burning/ pain with urination, get up at night to urinate, leakage of urine, stream starts and stops, trouble starting your streams, and have to strain to urinate .  Gastrointestinal (Lower):   Patient denies diarrhea and constipation.  Gastrointestinal (Upper):   Patient denies nausea and vomiting.  Constitutional:   Patient denies fever, night sweats, weight loss, and fatigue.  Skin:   Patient denies skin rash/ lesion and itching.  Eyes:   Patient denies blurred vision and double vision.  Ears/ Nose/ Throat:   Patient denies sinus problems and sore throat.  Hematologic/Lymphatic:   Patient denies swollen glands and easy bruising.  Cardiovascular:   Patient denies leg swelling and chest pains.  Respiratory:   Patient denies cough and  shortness of breath.  Endocrine:   Patient denies excessive thirst.  Musculoskeletal:   Patient denies back pain and joint pain.  Neurological:   Patient denies headaches and dizziness.  Psychologic:   Patient denies depression and anxiety.   VITAL SIGNS: None   MULTI-SYSTEM  PHYSICAL EXAMINATION:    Constitutional: Well-nourished. No physical deformities. Normally developed. Good grooming.     PAST DATA REVIEWED:  Source Of History:  Patient  Lab Test Review:   PSA  Records Review:   Pathology Reports, Previous Patient Records  X-Ray Review: PET Scan: Reviewed Films. We reviewed his recent fluciclovine PET scan that does not demonstrate any obvious measurable disease.    07/04/17 06/13/17 12/20/16 12/18/15 04/24/15 05/08/14  PSA  Total PSA 0.40 ng/mL 0.36 ng/mL 15.20 ng/dl 8.29 ng/dl 6.3 ng/dl 6.22 ng/dl    PROCEDURES: None   ASSESSMENT:      ICD-10 Details  1 GU:   Prostate Cancer - C61    PLAN:           Schedule Labs: 6 Months - PSA    6 Months - Urinalysis  Return Visit/Planned Activity: 6 Months - Office Visit, Lupron          Document Letter(s):  Created for Patient: Clinical Summary         Notes:   1. Lymph node-positive prostate cancer: We reviewed his persistent PSA elevation and the results of his fluciclovine PET scan today. After discussion with Dr. Tammi Klippel and Dr. Alen Blew, the patient is agreeable to proceed with aggressive salvage therapy including long-term androgen deprivation for at least 18 months along with radiation therapy. He will begin androgen deprivation therapy next week and should be ready to proceed with radiation therapy within the next 2 months.   He will be scheduled to see me in approximately 6 months to check his first post radiation therapy PSA and for his next Lupron injection.   Cc: Dr. Tyler Pita  Dr. Zola Button  Dr. Nolene Ebbs    E & M CODE: I spent at least 22 minutes face to face with the patient, more than 50% of that time was spent on counseling and/or coordinating care.

## 2017-08-05 NOTE — Progress Notes (Signed)
                               Care Plan Summary  Name: Phillip Williams DOB: 1951/05/31   Your Medical Team:   Urologist -  Dr. Raynelle Bring, [facility]  Radiation Oncologist - Dr. Tyler Pita, [facility]   Medical Oncologist - Dr. Zola Button, [place]  Recommendations: 1) Androgen Deprivation (hormone injection)  2) Radiation   3) CT of chest in 6 months to follow up on lung nodules   * These recommendations are based on information available as of today's consult.      Recommendations may change depending on the results of further tests or exams.  Next Steps: 1) Dr. Lynne Logan office will schedule hormone injection 2) Dr. Johny Shears office will schedule radiation planning and treatments 3) Dr. Alinda Money will schedule chest CT for 6 months  When appointments need to be scheduled, you will be contacted by [facility] and/or [facility].  Patient provided with business cards for all team members and a copy of "[place]" sheet. Questions?  Please do not hesitate to call Cira Rue, RN, BSN, OCN at [phone] any questions or concerns.  Shirlean Mylar is your Oncology Nurse Navigator and is available to assist you while you're receiving your medical care at [facility].

## 2017-08-05 NOTE — Progress Notes (Signed)
Reason for Referral: Prostate cancer.   HPI: This is a pleasant 66 year old Williams native of [place] but currently lives in this area. He is a Williams with history of hypertension and diabetes and was diagnosed with prostate cancer in 2018. At that time he had a PSA of 15.2 and a biopsy confirmed the presence of Gleason score 4+3 = 7. He underwent a radical prostatectomy under the care of Dr. Alinda Money on 03/17/2017. His follow pathology showed a Gleason score 4+3 = 7 with microscopic foci of extracapsular extension. He had 3 out of 13 lymph nodes involved in the right pelvis. On the left pelvis he had 6 benign lymph nodes. He showed well from his operation without any delayed issues. He regained his continence at this time and resumed work-related duties. He denied any back pain, shoulder pain or pathological fractures. He had a rise in his PSA initially to 0.36 in August 2018. In September 2018 and was 0.4. He underwent of Axumin PET scan which showed no evidence of any PET uptake in the chest abdomen or pelvis. He did have pulmonary nodules that are not consistent with metastatic prostate cancer.  He does not report any headaches, blurry vision, syncope or seizures. He does not report any fevers, chills or sweats. He does not report any chest pain, palpitation, orthopnea or leg edema. He does not report any cough, wheezing or hemoptysis. He does not report any nausea, vomiting or abdominal pain. He does not report any frequency urgency or hesitancy. He does not report any skeletal complaints. Remaining review of systems unremarkable.   Past Medical History:  Diagnosis Date  . Diabetes mellitus without complication ([place])   . Hypertension   . Prostate cancer [facility])   :  Past Surgical History:  Procedure Laterality Date  . LYMPHADENECTOMY Bilateral 03/17/2017   Procedure: PELVIC LYMPHADENECTOMY;  Surgeon: Raynelle Bring, MD;  Location: [facility];  Service: Urology;  Laterality: Bilateral;  . PROSTATE  BIOPSY    . ROBOT ASSISTED LAPAROSCOPIC RADICAL PROSTATECTOMY N/A 03/17/2017   Procedure: XI ROBOTIC ASSISTED LAPAROSCOPIC RADICAL PROSTATECTOMY LEVEL 2;  Surgeon: Raynelle Bring, MD;  Location: [facility];  Service: Urology;  Laterality: N/A;  :   Current Outpatient Prescriptions:  .  metFORMIN (GLUCOPHAGE) 500 MG tablet, Take 500 mg by mouth 2 (two) times daily with a meal., Disp: , Rfl:  .  Olmesartan-Amlodipine-HCTZ Phillip-10-25 MG TABS, Take 1 capsule by mouth every evening., Disp: , Rfl: 2 .  PAZEO 0.7 % SOLN, Place 1 drop into both eyes at bedtime., Disp: , Rfl: 1:  Allergies  Allergen Reactions  . Pork-Derived Products Other (See Comments)    Patient preference  :  No family history on file.:  Social History   Social History  . Marital status: Married    Spouse name: N/A  . Number of children: N/A  . Years of education: N/A   Occupational History  . Not on file.   Social History Main Topics  . Smoking status: Never Smoker  . Smokeless tobacco: Never Used  . Alcohol use No  . Drug use: No  . Sexual activity: Not on file   Other Topics Concern  . Not on file   Social History Narrative  . No narrative on file  :  Pertinent items are noted in HPI.  Exam: ECOG 0  General appearance: alert and cooperative appeared without distress. Throat: No oral thrush or ulcers. Neck: no adenopathy Resp: clear to auscultation bilaterally without wheezes or dullness to  percussion. Chest wall: no tenderness Cardio: regular rate and rhythm, S1, S2 normal, no murmur, click, rub or gallop GI: soft, non-tender; bowel sounds normal; no masses,  no organomegaly Extremities: extremities normal, atraumatic, no cyanosis or edema Pulses: 2+ and symmetric Skin: Skin color, texture, turgor normal. No rashes or lesions Lymph nodes: Cervical, supraclavicular, and axillary nodes normal.  CBC    Component Value Date/Time   WBC 7.2 03/07/2017 1006   RBC 5.37 03/07/2017 1006   HGB 14.3  03/18/2017 0452   HCT 41.3 03/18/2017 0452   PLT 241 03/07/2017 1006   MCV 83.4 03/07/2017 1006   MCH 28.9 03/07/2017 1006   MCHC 34.6 03/07/2017 1006   RDW 14.5 03/07/2017 1006     Chemistry      Component Value Date/Time   NA 137 03/07/2017 1006   K 4.4 03/07/2017 1006   CL 99 (L) 03/07/2017 1006   CO2 29 03/07/2017 1006   BUN 17 03/07/2017 1006   CREATININE 1.41 (H) 03/07/2017 1006      Component Value Date/Time   CALCIUM 10.1 03/07/2017 1006      Nm Pet (axumin) Skull Base To Mid Thigh  Result Date: 07/22/2017 CLINICAL DATA:  Prostate carcinoma (Gleason 4 + 4 equals 8. PSA equal 15.2 EXAM: NUCLEAR MEDICINE PET SKULL BASE TO THIGH TECHNIQUE: 10.6 mCi F-18 Fluciclovine was injected intravenously. Full-ring PET imaging was performed from the skull base to thigh after the radiotracer. CT data was obtained and used for attenuation correction and anatomic localization. COMPARISON:  Bone scan 01/31/2017, CT 01/31/2017 FINDINGS: NECK No radiotracer activity in neck lymph nodes. CHEST No radiotracer accumulation within mediastinal or hilar lymph nodes. Within the RIGHT lung apex there is a elongated smooth nodule measuring 2 cm (image 58, series 4). This does not have radiotracer accumulation. Smaller nodule in the superior segment of the RIGHT lower lobe measuring 7 mm (image 66, series 4. Small nodule along the inferior aspect the RIGHT oblique fissure measures 4 mm. These nodules do not accumulate radiotracer. ABDOMEN/PELVIS No focal radiotracer activity within the prostate bed. No radiotracer activity within pelvic lymph nodes. No radiotracer activity in periaortic lymph nodes. No enlarged lymph nodes identified. There is mild activity associated bilateral inguinal lymph nodes which is favored benign Physiologic activity noted within the liver and pancreas. Bladder wall mildly thickened. Atherosclerotic calcification of the aorta. SKELETON No focal radiotracer accumulation within the skeleton  to localize prostate cancer metastasis. Particular attention directed to the LEFT eighth rib. IMPRESSION: 1. No evidence of prostate cancer nodal metastasis, soft tissue metastasis, or bone metastasis. 2. Elongated nodule in the RIGHT upper lobe measuring up to 2 cm with smooth margins and no radiotracer accumulation is favored benign. Recommend chest CT with contrast to evaluate for a vascular anomaly. 3. Additional smaller RIGHT lung pulmonary nodules are favored benign. Electronically Signed   By: Suzy Bouchard M.D.   On: 07/22/2017 16:13    Assessment and Plan:   66 year old Williams with prostate cancer diagnosed in March 2018. His PSA was 15.2 and a Gleason score 4+3 = 7. He underwent a radical prostatectomy by Dr. Alinda Money on 03/17/2017. The final pathology revealed Gleason score 4+3 = 7 with microscopic foci of extracapsular extension but negative margins. He did have involvement of 3 out of 13 lymph nodes on the right side but 0 out of 6 on the left side. His most recent PSA was 0.4 in September 2018.  His case was discussed today and the prostate cancer  multidisciplinary clinic. He is pathology was discussed with the reviewing pathologist. Imaging studies were also discussed with radiology. He continues to have locally advanced with positive lymph nodes. The natural course of this disease was discussed today with the patient and aggressive therapy is warranted. Adjuvant radiation therapy with androgen deprivation therapy is recommended at this time. Complications associated with androgen deprivation were reviewed today including hot flashes, weight gain and sexual dysfunction. He is agreeable to proceed with this therapy at this time. All his questions were answered today.

## 2017-08-08 ENCOUNTER — Telehealth: Payer: Self-pay | Admitting: *Deleted

## 2017-08-08 NOTE — Telephone Encounter (Signed)
Called patient to inform of ADT appt. For 08/11/17 - arrival time - 9 am @ Dr. Lynne Logan Office and his sim appt. On 10-28-17 @ 10 am @ Dr. Johny Shears Office, lvm for a return call

## 2017-08-16 ENCOUNTER — Telehealth: Payer: Self-pay | Admitting: Medical Oncology

## 2017-08-16 NOTE — Telephone Encounter (Signed)
I called patient to follow up post Prostate MDC.  I left a message requesting a return call.

## 2017-08-16 NOTE — Telephone Encounter (Signed)
Patient called to confirm appointment for Prostate MDC.

## 2017-10-28 ENCOUNTER — Ambulatory Visit: Payer: BLUE CROSS/BLUE SHIELD | Admitting: Radiation Oncology

## 2017-11-07 ENCOUNTER — Encounter: Payer: Self-pay | Admitting: Medical Oncology

## 2017-11-08 ENCOUNTER — Ambulatory Visit: Payer: BLUE CROSS/BLUE SHIELD

## 2017-11-09 ENCOUNTER — Ambulatory Visit: Payer: BLUE CROSS/BLUE SHIELD | Admitting: Radiation Oncology

## 2017-11-09 ENCOUNTER — Ambulatory Visit: Payer: BLUE CROSS/BLUE SHIELD

## 2017-11-09 NOTE — Progress Notes (Signed)
  Radiation Oncology         (336) [phone] ________________________________  Name: Phillip Williams MRN: [id number]  Date: 11/10/2017  DOB: 01/09/1951  SIMULATION AND TREATMENT PLANNING NOTE    ICD-10-CM   1. Malignant neoplasm of prostate ([facility]) C61     DIAGNOSIS:  67 yo man with stage pT3aN1M0 adenocarcinoma of the prostate with a Gleason's score of 4+3 and a pretreatment PSA of 15.2, s/p UNS RALP with BPLND 03/17/17 with 3 positive lymph nodes and post treatment PSA of 0.4  NARRATIVE:  The patient was brought to the [facility].  Identity was confirmed.  All relevant records and images related to the planned course of therapy were reviewed.  The patient freely provided informed written consent to proceed with treatment after reviewing the details related to the planned course of therapy. The consent form was witnessed and verified by the simulation staff.  Then, the patient was set-up in a stable reproducible supine position for radiation therapy.  A vacuum lock pillow device was custom fabricated to position his legs in a reproducible immobilized position.  Then, we performed a urethrogram under sterile conditions to identify the prostatic bed.  CT images were obtained.  Surface markings were placed.  The CT images were loaded into the planning software.  Then the prostate target and avoidance structures including the rectum, bladder, bowel and hips were contoured.  Treatment planning then occurred.  The radiation prescription was entered and confirmed.  A total of one complex treatment devices were fabricated. I have requested : Intensity Modulated Radiotherapy (IMRT) is medically necessary for this case for the following reason:  Rectal sparing.Marland Kitchen  PLAN:  The patient will receive 45 Gy in 25 fractions of 1.8 Gy, followed by a boost to the prostate bed to a total dose of 68.4 Gy with 13 additional fractions of 1.8 Gy.  ________________________________  Sheral Apley Tammi Klippel,  M.D.

## 2017-11-10 ENCOUNTER — Ambulatory Visit: Payer: BLUE CROSS/BLUE SHIELD

## 2017-11-10 ENCOUNTER — Ambulatory Visit
Admission: RE | Admit: 2017-11-10 | Discharge: 2017-11-10 | Disposition: A | Discharge status: Home / Self Care | Payer: Commercial Managed Care - PPO | Source: Ambulatory Visit | Attending: Radiation Oncology | Admitting: Radiation Oncology

## 2017-11-10 DIAGNOSIS — C61 Malignant neoplasm of prostate: Secondary | ICD-10-CM | POA: Insufficient documentation

## 2017-11-10 DIAGNOSIS — Z510 Encounter for antineoplastic radiation therapy: Secondary | ICD-10-CM | POA: Diagnosis not present

## 2017-11-11 ENCOUNTER — Ambulatory Visit: Payer: BLUE CROSS/BLUE SHIELD

## 2017-11-14 ENCOUNTER — Ambulatory Visit: Payer: BLUE CROSS/BLUE SHIELD

## 2017-11-15 ENCOUNTER — Ambulatory Visit: Payer: BLUE CROSS/BLUE SHIELD

## 2017-11-16 ENCOUNTER — Ambulatory Visit: Payer: BLUE CROSS/BLUE SHIELD

## 2017-11-17 ENCOUNTER — Ambulatory Visit: Payer: BLUE CROSS/BLUE SHIELD

## 2017-11-18 ENCOUNTER — Ambulatory Visit: Payer: BLUE CROSS/BLUE SHIELD

## 2017-11-21 ENCOUNTER — Ambulatory Visit: Payer: BLUE CROSS/BLUE SHIELD

## 2017-11-22 ENCOUNTER — Ambulatory Visit: Payer: BLUE CROSS/BLUE SHIELD

## 2017-11-23 ENCOUNTER — Ambulatory Visit: Payer: BLUE CROSS/BLUE SHIELD

## 2017-11-24 ENCOUNTER — Ambulatory Visit: Payer: BLUE CROSS/BLUE SHIELD

## 2017-11-25 ENCOUNTER — Ambulatory Visit: Payer: BLUE CROSS/BLUE SHIELD

## 2017-11-25 DIAGNOSIS — C61 Malignant neoplasm of prostate: Secondary | ICD-10-CM | POA: Diagnosis not present

## 2017-11-27 ENCOUNTER — Ambulatory Visit: Payer: BLUE CROSS/BLUE SHIELD

## 2017-11-28 ENCOUNTER — Ambulatory Visit: Payer: BLUE CROSS/BLUE SHIELD

## 2017-11-28 DIAGNOSIS — C61 Malignant neoplasm of prostate: Secondary | ICD-10-CM | POA: Diagnosis not present

## 2017-11-29 ENCOUNTER — Ambulatory Visit: Payer: BLUE CROSS/BLUE SHIELD

## 2017-11-29 ENCOUNTER — Ambulatory Visit
Admission: RE | Admit: 2017-11-29 | Discharge: 2017-11-29 | Disposition: A | Discharge status: Home / Self Care | Payer: BLUE CROSS/BLUE SHIELD | Source: Ambulatory Visit | Attending: Radiation Oncology | Admitting: Radiation Oncology

## 2017-11-29 DIAGNOSIS — C61 Malignant neoplasm of prostate: Secondary | ICD-10-CM | POA: Diagnosis not present

## 2017-11-30 ENCOUNTER — Ambulatory Visit: Payer: BLUE CROSS/BLUE SHIELD

## 2017-11-30 ENCOUNTER — Ambulatory Visit
Admission: RE | Admit: 2017-11-30 | Discharge: 2017-11-30 | Disposition: A | Discharge status: Home / Self Care | Payer: BLUE CROSS/BLUE SHIELD | Source: Ambulatory Visit | Attending: Radiation Oncology | Admitting: Radiation Oncology

## 2017-11-30 DIAGNOSIS — C61 Malignant neoplasm of prostate: Secondary | ICD-10-CM | POA: Diagnosis not present

## 2017-12-01 ENCOUNTER — Ambulatory Visit: Payer: BLUE CROSS/BLUE SHIELD

## 2017-12-01 ENCOUNTER — Ambulatory Visit
Admission: RE | Admit: 2017-12-01 | Discharge: 2017-12-01 | Disposition: A | Discharge status: Home / Self Care | Payer: BLUE CROSS/BLUE SHIELD | Source: Ambulatory Visit | Attending: Radiation Oncology | Admitting: Radiation Oncology

## 2017-12-01 DIAGNOSIS — C61 Malignant neoplasm of prostate: Secondary | ICD-10-CM | POA: Diagnosis not present

## 2017-12-02 ENCOUNTER — Ambulatory Visit: Payer: BLUE CROSS/BLUE SHIELD

## 2017-12-02 ENCOUNTER — Ambulatory Visit
Admission: RE | Admit: 2017-12-02 | Discharge: 2017-12-02 | Disposition: A | Discharge status: Home / Self Care | Payer: BLUE CROSS/BLUE SHIELD | Source: Ambulatory Visit | Attending: Radiation Oncology | Admitting: Radiation Oncology

## 2017-12-02 DIAGNOSIS — C61 Malignant neoplasm of prostate: Secondary | ICD-10-CM | POA: Diagnosis not present

## 2017-12-05 ENCOUNTER — Ambulatory Visit: Payer: BLUE CROSS/BLUE SHIELD

## 2017-12-05 ENCOUNTER — Ambulatory Visit
Admission: RE | Admit: 2017-12-05 | Discharge: 2017-12-05 | Disposition: A | Discharge status: Home / Self Care | Payer: BLUE CROSS/BLUE SHIELD | Source: Ambulatory Visit | Attending: Radiation Oncology | Admitting: Radiation Oncology

## 2017-12-05 DIAGNOSIS — C61 Malignant neoplasm of prostate: Secondary | ICD-10-CM | POA: Diagnosis not present

## 2017-12-06 ENCOUNTER — Ambulatory Visit: Payer: BLUE CROSS/BLUE SHIELD

## 2017-12-06 ENCOUNTER — Ambulatory Visit
Admission: RE | Admit: 2017-12-06 | Discharge: 2017-12-06 | Disposition: A | Discharge status: Home / Self Care | Payer: BLUE CROSS/BLUE SHIELD | Source: Ambulatory Visit | Attending: Radiation Oncology | Admitting: Radiation Oncology

## 2017-12-06 DIAGNOSIS — C61 Malignant neoplasm of prostate: Secondary | ICD-10-CM | POA: Diagnosis not present

## 2017-12-07 ENCOUNTER — Ambulatory Visit: Payer: BLUE CROSS/BLUE SHIELD

## 2017-12-07 ENCOUNTER — Ambulatory Visit
Admission: RE | Admit: 2017-12-07 | Discharge: 2017-12-07 | Disposition: A | Discharge status: Home / Self Care | Payer: BLUE CROSS/BLUE SHIELD | Source: Ambulatory Visit | Attending: Radiation Oncology | Admitting: Radiation Oncology

## 2017-12-07 DIAGNOSIS — C61 Malignant neoplasm of prostate: Secondary | ICD-10-CM | POA: Diagnosis not present

## 2017-12-08 ENCOUNTER — Ambulatory Visit
Admission: RE | Admit: 2017-12-08 | Discharge: 2017-12-08 | Disposition: A | Discharge status: Home / Self Care | Payer: Commercial Managed Care - PPO | Source: Ambulatory Visit | Attending: Radiation Oncology | Admitting: Radiation Oncology

## 2017-12-08 ENCOUNTER — Ambulatory Visit: Payer: BLUE CROSS/BLUE SHIELD

## 2017-12-08 DIAGNOSIS — C61 Malignant neoplasm of prostate: Secondary | ICD-10-CM | POA: Diagnosis not present

## 2017-12-09 ENCOUNTER — Ambulatory Visit: Payer: BLUE CROSS/BLUE SHIELD

## 2017-12-12 ENCOUNTER — Ambulatory Visit
Admission: RE | Admit: 2017-12-12 | Discharge: 2017-12-12 | Disposition: A | Discharge status: Home / Self Care | Payer: BLUE CROSS/BLUE SHIELD | Source: Ambulatory Visit | Attending: Radiation Oncology | Admitting: Radiation Oncology

## 2017-12-12 ENCOUNTER — Ambulatory Visit: Payer: BLUE CROSS/BLUE SHIELD

## 2017-12-12 DIAGNOSIS — C61 Malignant neoplasm of prostate: Secondary | ICD-10-CM | POA: Diagnosis not present

## 2017-12-13 ENCOUNTER — Ambulatory Visit: Payer: BLUE CROSS/BLUE SHIELD

## 2017-12-13 ENCOUNTER — Ambulatory Visit
Admission: RE | Admit: 2017-12-13 | Discharge: 2017-12-13 | Disposition: A | Discharge status: Home / Self Care | Payer: BLUE CROSS/BLUE SHIELD | Source: Ambulatory Visit | Attending: Radiation Oncology | Admitting: Radiation Oncology

## 2017-12-13 DIAGNOSIS — C61 Malignant neoplasm of prostate: Secondary | ICD-10-CM | POA: Diagnosis not present

## 2017-12-14 ENCOUNTER — Ambulatory Visit
Admission: RE | Admit: 2017-12-14 | Discharge: 2017-12-14 | Disposition: A | Discharge status: Home / Self Care | Payer: BLUE CROSS/BLUE SHIELD | Source: Ambulatory Visit | Attending: Radiation Oncology | Admitting: Radiation Oncology

## 2017-12-14 ENCOUNTER — Ambulatory Visit: Payer: BLUE CROSS/BLUE SHIELD

## 2017-12-14 DIAGNOSIS — C61 Malignant neoplasm of prostate: Secondary | ICD-10-CM | POA: Diagnosis not present

## 2017-12-15 ENCOUNTER — Ambulatory Visit: Payer: BLUE CROSS/BLUE SHIELD

## 2017-12-15 ENCOUNTER — Ambulatory Visit
Admission: RE | Admit: 2017-12-15 | Discharge: 2017-12-15 | Disposition: A | Discharge status: Home / Self Care | Payer: BLUE CROSS/BLUE SHIELD | Source: Ambulatory Visit | Attending: Radiation Oncology | Admitting: Radiation Oncology

## 2017-12-15 DIAGNOSIS — C61 Malignant neoplasm of prostate: Secondary | ICD-10-CM | POA: Diagnosis not present

## 2017-12-16 ENCOUNTER — Ambulatory Visit: Payer: BLUE CROSS/BLUE SHIELD

## 2017-12-16 ENCOUNTER — Ambulatory Visit
Admission: RE | Admit: 2017-12-16 | Discharge: 2017-12-16 | Disposition: A | Discharge status: Home / Self Care | Payer: BLUE CROSS/BLUE SHIELD | Source: Ambulatory Visit | Attending: Radiation Oncology | Admitting: Radiation Oncology

## 2017-12-16 DIAGNOSIS — C61 Malignant neoplasm of prostate: Secondary | ICD-10-CM | POA: Diagnosis not present

## 2017-12-19 ENCOUNTER — Ambulatory Visit: Payer: BLUE CROSS/BLUE SHIELD

## 2017-12-19 ENCOUNTER — Ambulatory Visit
Admission: RE | Admit: 2017-12-19 | Discharge: 2017-12-19 | Disposition: A | Discharge status: Home / Self Care | Payer: Commercial Managed Care - PPO | Source: Ambulatory Visit | Attending: Radiation Oncology | Admitting: Radiation Oncology

## 2017-12-19 DIAGNOSIS — C61 Malignant neoplasm of prostate: Secondary | ICD-10-CM | POA: Diagnosis not present

## 2017-12-20 ENCOUNTER — Ambulatory Visit: Payer: BLUE CROSS/BLUE SHIELD

## 2017-12-20 ENCOUNTER — Ambulatory Visit
Admission: RE | Admit: 2017-12-20 | Discharge: 2017-12-20 | Disposition: A | Discharge status: Home / Self Care | Payer: BLUE CROSS/BLUE SHIELD | Source: Ambulatory Visit | Attending: Radiation Oncology | Admitting: Radiation Oncology

## 2017-12-20 DIAGNOSIS — C61 Malignant neoplasm of prostate: Secondary | ICD-10-CM | POA: Diagnosis not present

## 2017-12-21 ENCOUNTER — Ambulatory Visit
Admission: RE | Admit: 2017-12-21 | Discharge: 2017-12-21 | Disposition: A | Discharge status: Home / Self Care | Payer: BLUE CROSS/BLUE SHIELD | Source: Ambulatory Visit | Attending: Radiation Oncology | Admitting: Radiation Oncology

## 2017-12-21 ENCOUNTER — Ambulatory Visit: Payer: BLUE CROSS/BLUE SHIELD

## 2017-12-21 DIAGNOSIS — C61 Malignant neoplasm of prostate: Secondary | ICD-10-CM | POA: Diagnosis not present

## 2017-12-22 ENCOUNTER — Ambulatory Visit
Admission: RE | Admit: 2017-12-22 | Discharge: 2017-12-22 | Disposition: A | Discharge status: Home / Self Care | Payer: BLUE CROSS/BLUE SHIELD | Source: Ambulatory Visit | Attending: Radiation Oncology | Admitting: Radiation Oncology

## 2017-12-22 ENCOUNTER — Ambulatory Visit: Payer: BLUE CROSS/BLUE SHIELD

## 2017-12-22 DIAGNOSIS — C61 Malignant neoplasm of prostate: Secondary | ICD-10-CM | POA: Diagnosis not present

## 2017-12-23 ENCOUNTER — Ambulatory Visit: Payer: BLUE CROSS/BLUE SHIELD

## 2017-12-23 ENCOUNTER — Encounter: Payer: Self-pay | Admitting: Medical Oncology

## 2017-12-23 ENCOUNTER — Ambulatory Visit
Admission: RE | Admit: 2017-12-23 | Discharge: 2017-12-23 | Disposition: A | Discharge status: Home / Self Care | Payer: Commercial Managed Care - PPO | Source: Ambulatory Visit | Attending: Radiation Oncology | Admitting: Radiation Oncology

## 2017-12-23 DIAGNOSIS — Z510 Encounter for antineoplastic radiation therapy: Secondary | ICD-10-CM | POA: Diagnosis present

## 2017-12-23 DIAGNOSIS — C61 Malignant neoplasm of prostate: Secondary | ICD-10-CM | POA: Diagnosis present

## 2017-12-26 ENCOUNTER — Ambulatory Visit
Admission: RE | Admit: 2017-12-26 | Discharge: 2017-12-26 | Disposition: A | Discharge status: Home / Self Care | Payer: Commercial Managed Care - PPO | Source: Ambulatory Visit | Attending: Radiation Oncology | Admitting: Radiation Oncology

## 2017-12-26 ENCOUNTER — Ambulatory Visit: Payer: BLUE CROSS/BLUE SHIELD

## 2017-12-26 DIAGNOSIS — Z510 Encounter for antineoplastic radiation therapy: Secondary | ICD-10-CM | POA: Diagnosis not present

## 2017-12-27 ENCOUNTER — Ambulatory Visit: Payer: BLUE CROSS/BLUE SHIELD

## 2017-12-27 ENCOUNTER — Ambulatory Visit
Admission: RE | Admit: 2017-12-27 | Discharge: 2017-12-27 | Disposition: A | Discharge status: Home / Self Care | Payer: Commercial Managed Care - PPO | Source: Ambulatory Visit | Attending: Radiation Oncology | Admitting: Radiation Oncology

## 2017-12-27 DIAGNOSIS — Z510 Encounter for antineoplastic radiation therapy: Secondary | ICD-10-CM | POA: Diagnosis not present

## 2017-12-28 ENCOUNTER — Ambulatory Visit
Admission: RE | Admit: 2017-12-28 | Discharge: 2017-12-28 | Disposition: A | Discharge status: Home / Self Care | Payer: Commercial Managed Care - PPO | Source: Ambulatory Visit | Attending: Radiation Oncology | Admitting: Radiation Oncology

## 2017-12-28 ENCOUNTER — Ambulatory Visit: Payer: BLUE CROSS/BLUE SHIELD

## 2017-12-28 DIAGNOSIS — Z510 Encounter for antineoplastic radiation therapy: Secondary | ICD-10-CM | POA: Diagnosis not present

## 2017-12-29 ENCOUNTER — Ambulatory Visit
Admission: RE | Admit: 2017-12-29 | Discharge: 2017-12-29 | Disposition: A | Discharge status: Home / Self Care | Payer: Commercial Managed Care - PPO | Source: Ambulatory Visit | Attending: Radiation Oncology | Admitting: Radiation Oncology

## 2017-12-29 ENCOUNTER — Ambulatory Visit: Payer: BLUE CROSS/BLUE SHIELD

## 2017-12-29 DIAGNOSIS — Z510 Encounter for antineoplastic radiation therapy: Secondary | ICD-10-CM | POA: Diagnosis not present

## 2017-12-30 ENCOUNTER — Ambulatory Visit
Admission: RE | Admit: 2017-12-30 | Discharge: 2017-12-30 | Disposition: A | Discharge status: Home / Self Care | Payer: Commercial Managed Care - PPO | Source: Ambulatory Visit | Attending: Radiation Oncology | Admitting: Radiation Oncology

## 2017-12-30 DIAGNOSIS — Z510 Encounter for antineoplastic radiation therapy: Secondary | ICD-10-CM | POA: Diagnosis not present

## 2018-01-02 ENCOUNTER — Ambulatory Visit: Payer: BLUE CROSS/BLUE SHIELD

## 2018-01-02 ENCOUNTER — Ambulatory Visit
Admission: RE | Admit: 2018-01-02 | Discharge: 2018-01-02 | Disposition: A | Discharge status: Home / Self Care | Payer: Commercial Managed Care - PPO | Source: Ambulatory Visit | Attending: Radiation Oncology | Admitting: Radiation Oncology

## 2018-01-02 DIAGNOSIS — Z510 Encounter for antineoplastic radiation therapy: Secondary | ICD-10-CM | POA: Diagnosis not present

## 2018-01-03 ENCOUNTER — Ambulatory Visit
Admission: RE | Admit: 2018-01-03 | Discharge: 2018-01-03 | Disposition: A | Discharge status: Home / Self Care | Payer: Commercial Managed Care - PPO | Source: Ambulatory Visit | Attending: Radiation Oncology | Admitting: Radiation Oncology

## 2018-01-03 ENCOUNTER — Ambulatory Visit: Payer: BLUE CROSS/BLUE SHIELD

## 2018-01-03 DIAGNOSIS — Z510 Encounter for antineoplastic radiation therapy: Secondary | ICD-10-CM | POA: Diagnosis not present

## 2018-01-04 ENCOUNTER — Ambulatory Visit
Admission: RE | Admit: 2018-01-04 | Discharge: 2018-01-04 | Disposition: A | Discharge status: Home / Self Care | Payer: Commercial Managed Care - PPO | Source: Ambulatory Visit | Attending: Radiation Oncology | Admitting: Radiation Oncology

## 2018-01-05 ENCOUNTER — Ambulatory Visit
Admission: RE | Admit: 2018-01-05 | Discharge: 2018-01-05 | Disposition: A | Discharge status: Home / Self Care | Payer: Commercial Managed Care - PPO | Source: Ambulatory Visit | Attending: Radiation Oncology | Admitting: Radiation Oncology

## 2018-01-06 ENCOUNTER — Ambulatory Visit
Admission: RE | Admit: 2018-01-06 | Discharge: 2018-01-06 | Disposition: A | Discharge status: Home / Self Care | Payer: Commercial Managed Care - PPO | Source: Ambulatory Visit | Attending: Radiation Oncology | Admitting: Radiation Oncology

## 2018-01-09 ENCOUNTER — Ambulatory Visit
Admission: RE | Admit: 2018-01-09 | Discharge: 2018-01-09 | Disposition: A | Discharge status: Home / Self Care | Payer: Commercial Managed Care - PPO | Source: Ambulatory Visit | Attending: Radiation Oncology | Admitting: Radiation Oncology

## 2018-01-09 DIAGNOSIS — Z510 Encounter for antineoplastic radiation therapy: Secondary | ICD-10-CM | POA: Diagnosis not present

## 2018-01-10 ENCOUNTER — Ambulatory Visit
Admission: RE | Admit: 2018-01-10 | Discharge: 2018-01-10 | Disposition: A | Discharge status: Home / Self Care | Payer: Commercial Managed Care - PPO | Source: Ambulatory Visit | Attending: Radiation Oncology | Admitting: Radiation Oncology

## 2018-01-10 ENCOUNTER — Ambulatory Visit: Payer: BLUE CROSS/BLUE SHIELD

## 2018-01-10 DIAGNOSIS — Z510 Encounter for antineoplastic radiation therapy: Secondary | ICD-10-CM | POA: Diagnosis not present

## 2018-01-11 ENCOUNTER — Ambulatory Visit: Payer: BLUE CROSS/BLUE SHIELD

## 2018-01-11 ENCOUNTER — Ambulatory Visit
Admission: RE | Admit: 2018-01-11 | Discharge: 2018-01-11 | Disposition: A | Discharge status: Home / Self Care | Payer: Commercial Managed Care - PPO | Source: Ambulatory Visit | Attending: Radiation Oncology | Admitting: Radiation Oncology

## 2018-01-11 DIAGNOSIS — Z510 Encounter for antineoplastic radiation therapy: Secondary | ICD-10-CM | POA: Diagnosis not present

## 2018-01-12 ENCOUNTER — Ambulatory Visit
Admission: RE | Admit: 2018-01-12 | Discharge: 2018-01-12 | Disposition: A | Discharge status: Home / Self Care | Payer: Commercial Managed Care - PPO | Source: Ambulatory Visit | Attending: Radiation Oncology | Admitting: Radiation Oncology

## 2018-01-12 DIAGNOSIS — Z510 Encounter for antineoplastic radiation therapy: Secondary | ICD-10-CM | POA: Diagnosis not present

## 2018-01-13 ENCOUNTER — Ambulatory Visit
Admission: RE | Admit: 2018-01-13 | Discharge: 2018-01-13 | Disposition: A | Discharge status: Home / Self Care | Payer: Commercial Managed Care - PPO | Source: Ambulatory Visit | Attending: Radiation Oncology | Admitting: Radiation Oncology

## 2018-01-13 DIAGNOSIS — Z510 Encounter for antineoplastic radiation therapy: Secondary | ICD-10-CM | POA: Diagnosis not present

## 2018-01-16 ENCOUNTER — Ambulatory Visit
Admission: RE | Admit: 2018-01-16 | Discharge: 2018-01-16 | Disposition: A | Discharge status: Home / Self Care | Payer: Commercial Managed Care - PPO | Source: Ambulatory Visit | Attending: Radiation Oncology | Admitting: Radiation Oncology

## 2018-01-16 DIAGNOSIS — Z510 Encounter for antineoplastic radiation therapy: Secondary | ICD-10-CM | POA: Diagnosis not present

## 2018-01-17 ENCOUNTER — Ambulatory Visit
Admission: RE | Admit: 2018-01-17 | Discharge: 2018-01-17 | Disposition: A | Discharge status: Home / Self Care | Payer: Commercial Managed Care - PPO | Source: Ambulatory Visit | Attending: Radiation Oncology | Admitting: Radiation Oncology

## 2018-01-17 ENCOUNTER — Ambulatory Visit: Payer: BLUE CROSS/BLUE SHIELD

## 2018-01-17 DIAGNOSIS — Z510 Encounter for antineoplastic radiation therapy: Secondary | ICD-10-CM | POA: Diagnosis not present

## 2018-01-18 ENCOUNTER — Ambulatory Visit: Payer: BLUE CROSS/BLUE SHIELD

## 2018-01-18 ENCOUNTER — Ambulatory Visit
Admission: RE | Admit: 2018-01-18 | Discharge: 2018-01-18 | Disposition: A | Discharge status: Home / Self Care | Payer: Commercial Managed Care - PPO | Source: Ambulatory Visit | Attending: Radiation Oncology | Admitting: Radiation Oncology

## 2018-01-18 DIAGNOSIS — Z510 Encounter for antineoplastic radiation therapy: Secondary | ICD-10-CM | POA: Diagnosis not present

## 2018-01-19 ENCOUNTER — Ambulatory Visit
Admission: RE | Admit: 2018-01-19 | Discharge: 2018-01-19 | Disposition: A | Discharge status: Home / Self Care | Payer: Commercial Managed Care - PPO | Source: Ambulatory Visit | Attending: Radiation Oncology | Admitting: Radiation Oncology

## 2018-01-19 ENCOUNTER — Encounter: Payer: Self-pay | Admitting: Radiation Oncology

## 2018-01-19 DIAGNOSIS — Z510 Encounter for antineoplastic radiation therapy: Secondary | ICD-10-CM | POA: Diagnosis not present

## 2018-01-20 NOTE — Progress Notes (Signed)
  Radiation Oncology         (336) [phone] ________________________________  Name: Phillip Williams MRN: [id number]  Date: 01/19/2018  DOB: 1951-05-02  End of Treatment Note  Diagnosis:   67 yo man with stage pT3aN1M0 adenocarcinoma of the prostate with a Gleason's score of 4+3 and a pretreatment PSA of 15.2, s/p UNS RALP with BPLND 03/17/17 with 3 positive lymph nodes and post treatment PSA of 0.4  Indication for treatment:  Curative       Radiation treatment dates:   11/28/2017 to 01/19/2018  Site/dose:    1. The prostate bed was treated to 45 Gy in 25 fractions of 1.8 Gy. 2. The target was boosted to 23.4 Gy in 13 fractions of 1.8 Gy.  Beams/energy:    1. IMRT // 6X 2. IMRT // 6X  Narrative: The patient tolerated radiation treatment relatively well with only mild LUTS with occasional post void dribble, increased urinary frequency, urgency and nocturia x4. He denied dysuria, gross hematuria, incontinence, bowel issues, or significant fatigue. He reported maintaining a strong stream and ability to empty his bladder without issue.  Plan: The patient has completed radiation treatment. The patient will return to radiation oncology clinic for routine followup in one month. I advised him to call or return sooner if he has any questions or concerns related to his recovery or treatment. ________________________________  Sheral Apley. Tammi Klippel, M.D.  This document serves as a record of services personally performed by Tyler Pita, MD. It was created on his behalf by Arlyce Harman, a trained medical scribe. The creation of this record is based on the scribe's personal observations and the provider's statements to them. This document has been checked and approved by the attending provider.

## 2018-02-22 ENCOUNTER — Ambulatory Visit: Payer: Self-pay | Admitting: Urology

## 2018-02-23 ENCOUNTER — Encounter: Payer: Self-pay | Admitting: Urology

## 2018-02-23 ENCOUNTER — Ambulatory Visit
Admission: RE | Admit: 2018-02-23 | Discharge: 2018-02-23 | Disposition: A | Discharge status: Home / Self Care | Payer: Commercial Managed Care - PPO | Source: Ambulatory Visit | Attending: Urology | Admitting: Urology

## 2018-02-23 ENCOUNTER — Other Ambulatory Visit: Payer: Self-pay

## 2018-02-23 VITALS — BP 110/67 | HR 86 | Temp 98.1°F | Resp 18 | Ht 68.0 in | Wt 234.0 lb

## 2018-02-23 DIAGNOSIS — Z923 Personal history of irradiation: Secondary | ICD-10-CM | POA: Diagnosis not present

## 2018-02-23 DIAGNOSIS — Z79899 Other long term (current) drug therapy: Secondary | ICD-10-CM | POA: Diagnosis not present

## 2018-02-23 DIAGNOSIS — R350 Frequency of micturition: Secondary | ICD-10-CM | POA: Insufficient documentation

## 2018-02-23 DIAGNOSIS — Z7984 Long term (current) use of oral hypoglycemic drugs: Secondary | ICD-10-CM | POA: Diagnosis not present

## 2018-02-23 DIAGNOSIS — C61 Malignant neoplasm of prostate: Secondary | ICD-10-CM | POA: Diagnosis present

## 2018-02-23 NOTE — Addendum Note (Signed)
Encounter addended by: Malena Edman, RN on: 02/23/2018 10:54 AM  Actions taken: Charge Capture section accepted

## 2018-02-23 NOTE — Progress Notes (Signed)
Radiation Oncology         (336) [phone] ________________________________  Name: Phillip Williams MRN: [id number]  Date: 02/23/2018  DOB: 11-30-1950  Post Treatment Note  CC: Nolene Ebbs, MD  Raynelle Bring, MD  Diagnosis:   67 yo man withstage pT3aN1M0 adenocarcinoma of the prostate with a Gleason's score of 4+3 and a pretreatment PSA of 15.2, s/p UNS RALP with BPLND 03/17/17 with 3 positive lymph nodes and post treatment PSA of 0.4  Interval Since Last Radiation:  5 weeks  11/28/2017 to 01/19/2018: 1. The prostate bed was treated to 45 Gy in 25 fractions of 1.8 Gy. 2. The target was boosted to 23.4 Gy in 13 fractions of 1.8 Gy.  Narrative:  The patient returns today for routine follow-up.  He tolerated radiation treatment relatively well with only mild LUTS with occasional post void dribble, increased urinary frequency, urgency and nocturia x4. He denied dysuria, gross hematuria, incontinence, bowel issues, or significant fatigue. He reported maintaining a strong stream and ability to empty his bladder without issue.   He continued on ADT throughout his course of radiotherapy.                        On review of systems, the patient states that he is doing very well overall.  He continues with mild increased frequency and urgency but specifically denies dysuria, gross hematuria, hesitancy, intermittency, straining or feelings of incomplete emptying.  He reports nocturia x3 per night which is tolerable.  His current IPSS score is 7 indicating mild symptomology.  He reports a healthy appetite and is maintaining his weight.  He denies abdominal pain, nausea, vomiting or diarrhea.  He feels that his fatigue is gradually improving and reports that this is not interfering with his normal daily activities.  He feels that he is tolerating the ADT well and is quite pleased with his overall progress to date.  ALLERGIES:  is allergic to pork-derived products.  Meds: Current Outpatient Medications    Medication Sig Dispense Refill  . metFORMIN (GLUCOPHAGE) 500 MG tablet Take 500 mg by mouth 2 (two) times daily with a meal.    . Olmesartan-Amlodipine-HCTZ 40-10-25 MG TABS Take 1 capsule by mouth every evening.  2  . PAZEO 0.7 % SOLN Place 1 drop into both eyes at bedtime.  1   No current facility-administered medications for this encounter.     Physical Findings:  height is 5\' 8"  (1.727 m) and weight is 234 lb (106.1 kg). His oral temperature is 98.1 F (36.7 C). His blood pressure is 110/67 and his pulse is 86. His respiration is 18 and oxygen saturation is 100%.  Pain Assessment Pain Score: 0-No pain/10 In general this is a well appearing African male in no acute distress.  He's alert and oriented x4 and appropriate throughout the examination. Cardiopulmonary assessment is negative for acute distress and he exhibits normal effort.   Lab Findings: Lab Results  Component Value Date   WBC 7.2 03/07/2017   HGB 14.3 03/18/2017   HCT 41.3 03/18/2017   MCV 83.4 03/07/2017   PLT 241 03/07/2017     Radiographic Findings: No results found.  Impression/Plan: 69.   67 yo man withstage pT3aN1M0 adenocarcinoma of the prostate with a Gleason's score of 4+3 and a pretreatment PSA of 15.2, s/p UNS RALP with BPLND 03/17/17 with 3 positive lymph nodes and post treatment PSA of 0.4  He tolerated radiotherapy well and appears to be recovering in  standard fashion.  He will continue to follow up with urology for ongoing PSA determinations and has an appointment scheduled with [facility] on Mar 08, 2018 for repeat Lupron injection.  He plans to complete an 18 to 63-month course of hormone therapy.    He has a follow-up appointment with Dr. Alinda Money scheduled for September 06, 2018.  He understands what to expect with regards to PSA monitoring going forward. I will look forward to following his response to treatment via correspondence with urology, and would be happy to continue to participate in his  care if clinically indicated. I talked to the patient about what to expect in the future, including his risk for erectile dysfunction and rectal bleeding. I encouraged him to call or return to the office if he has any questions regarding his previous radiation or possible radiation side effects. He was comfortable with this plan and will follow up as needed.     Nicholos Johns, PA-C

## 2018-03-30 IMAGING — NM NM BONE WHOLE BODY
2 series · 2 of 2 positions shown · non-contrast
Comparison: None.

CLINICAL DATA: Prostate cancer

EXAM:
NUCLEAR MEDICINE WHOLE BODY BONE SCAN
TECHNIQUE: Whole body anterior and posterior images were obtained approximately
3 hours after intravenous injection of radiopharmaceutical.
RADIOPHARMACEUTICALS:  21.5 mCi Mechnetium-TTm MDP IV

[Series 1: whole body · 2.66mm/px · 1 of 1 slices shown (1 of 2)]
[im 1/1]
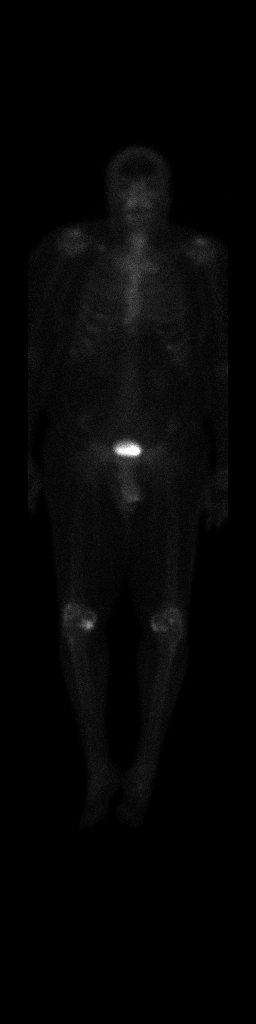

[Series 1: whole body · 2.66mm/px · 1 of 1 slices shown (2 of 2)]
[im 1/1]
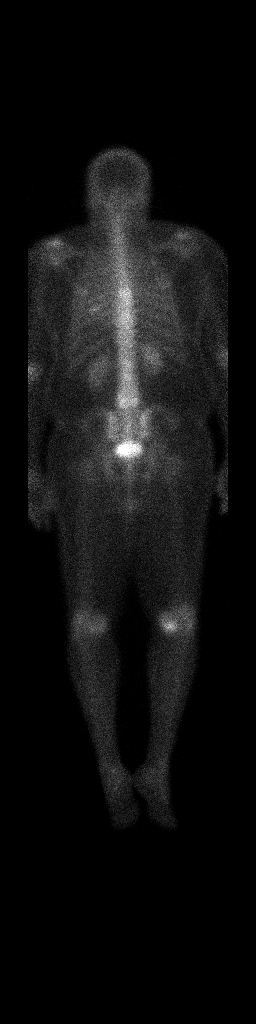

[2 of 2 positions shown; findings below may reference images not displayed]

FINDINGS: There are marked degenerate changes involving both knees, right
greater than left. Bilateral AC joint osteoarthritis noted.
Degenerative type changes are also noted within the lower thoracic
and lumbar spine. There is linear increased radiotracer uptake
localizing to the posterior aspect of the left eighth rib.
Physiologic tracer activity is identified within the kidneys an
urinary bladder.
IMPRESSION: 1. Indeterminate increased radiotracer uptake in a linear fashion
along the posterior aspect of the left eighth rib. Correlation with
plain film radiographs of the ribs advised.

2.  Degenerative changes within both knees, spine and shoulders.

## 2018-08-26 ENCOUNTER — Observation Stay (HOSPITAL_COMMUNITY)
Admission: EM | Admit: 2018-08-26 | Discharge: 2018-08-27 | Disposition: A | Discharge status: Home / Self Care | Payer: Commercial Managed Care - PPO | Attending: Internal Medicine | Admitting: Internal Medicine

## 2018-08-26 ENCOUNTER — Emergency Department (HOSPITAL_COMMUNITY): Payer: Commercial Managed Care - PPO

## 2018-08-26 ENCOUNTER — Encounter (HOSPITAL_COMMUNITY): Payer: Self-pay | Admitting: Emergency Medicine

## 2018-08-26 DIAGNOSIS — Z79899 Other long term (current) drug therapy: Secondary | ICD-10-CM | POA: Diagnosis not present

## 2018-08-26 DIAGNOSIS — R5383 Other fatigue: Secondary | ICD-10-CM | POA: Insufficient documentation

## 2018-08-26 DIAGNOSIS — N183 Chronic kidney disease, stage 3 unspecified: Secondary | ICD-10-CM

## 2018-08-26 DIAGNOSIS — I10 Essential (primary) hypertension: Secondary | ICD-10-CM | POA: Diagnosis present

## 2018-08-26 DIAGNOSIS — I420 Dilated cardiomyopathy: Secondary | ICD-10-CM | POA: Diagnosis not present

## 2018-08-26 DIAGNOSIS — Z9079 Acquired absence of other genital organ(s): Secondary | ICD-10-CM | POA: Insufficient documentation

## 2018-08-26 DIAGNOSIS — Z9889 Other specified postprocedural states: Secondary | ICD-10-CM | POA: Diagnosis not present

## 2018-08-26 DIAGNOSIS — I129 Hypertensive chronic kidney disease with stage 1 through stage 4 chronic kidney disease, or unspecified chronic kidney disease: Secondary | ICD-10-CM | POA: Insufficient documentation

## 2018-08-26 DIAGNOSIS — R7989 Other specified abnormal findings of blood chemistry: Secondary | ICD-10-CM | POA: Diagnosis present

## 2018-08-26 DIAGNOSIS — I4891 Unspecified atrial fibrillation: Principal | ICD-10-CM | POA: Diagnosis present

## 2018-08-26 DIAGNOSIS — Z8546 Personal history of malignant neoplasm of prostate: Secondary | ICD-10-CM | POA: Insufficient documentation

## 2018-08-26 DIAGNOSIS — E1122 Type 2 diabetes mellitus with diabetic chronic kidney disease: Secondary | ICD-10-CM | POA: Diagnosis not present

## 2018-08-26 DIAGNOSIS — R0602 Shortness of breath: Secondary | ICD-10-CM | POA: Insufficient documentation

## 2018-08-26 DIAGNOSIS — I208 Other forms of angina pectoris: Secondary | ICD-10-CM | POA: Diagnosis present

## 2018-08-26 DIAGNOSIS — R0683 Snoring: Secondary | ICD-10-CM | POA: Diagnosis present

## 2018-08-26 DIAGNOSIS — Z91018 Allergy to other foods: Secondary | ICD-10-CM | POA: Insufficient documentation

## 2018-08-26 DIAGNOSIS — E119 Type 2 diabetes mellitus without complications: Secondary | ICD-10-CM

## 2018-08-26 DIAGNOSIS — E785 Hyperlipidemia, unspecified: Secondary | ICD-10-CM | POA: Insufficient documentation

## 2018-08-26 DIAGNOSIS — R0789 Other chest pain: Secondary | ICD-10-CM | POA: Insufficient documentation

## 2018-08-26 DIAGNOSIS — I2089 Other forms of angina pectoris: Secondary | ICD-10-CM | POA: Diagnosis present

## 2018-08-26 DIAGNOSIS — D649 Anemia, unspecified: Secondary | ICD-10-CM | POA: Diagnosis present

## 2018-08-26 DIAGNOSIS — R110 Nausea: Secondary | ICD-10-CM | POA: Insufficient documentation

## 2018-08-26 LAB — CBC
HCT: 34.9 % — ABNORMAL LOW (ref 39.0–52.0)
Hemoglobin: 11.5 g/dL — ABNORMAL LOW (ref 13.0–17.0)
MCH: 28.3 pg (ref 26.0–34.0)
MCHC: 33 g/dL (ref 30.0–36.0)
MCV: 85.7 fL (ref 80.0–100.0)
NRBC: 0 % (ref 0.0–0.2)
PLATELETS: 250 10*3/uL (ref 150–400)
RBC: 4.07 MIL/uL — AB (ref 4.22–5.81)
RDW: 14.6 % (ref 11.5–15.5)
WBC: 5.5 10*3/uL (ref 4.0–10.5)

## 2018-08-26 LAB — BASIC METABOLIC PANEL
ANION GAP: 10 (ref 5–15)
BUN: 21 mg/dL (ref 8–23)
CO2: 28 mmol/L (ref 22–32)
Calcium: 10.2 mg/dL (ref 8.9–10.3)
Chloride: 100 mmol/L (ref 98–111)
Creatinine, Ser: 1.52 mg/dL — ABNORMAL HIGH (ref 0.61–1.24)
GFR calc Af Amer: 52 mL/min — ABNORMAL LOW (ref >60)
GFR, EST NON AFRICAN AMERICAN: 45 mL/min — AB (ref >60)
GLUCOSE: 126 mg/dL — AB (ref 70–99)
Potassium: 3.7 mmol/L (ref 3.5–5.1)
Sodium: 138 mmol/L (ref 135–145)

## 2018-08-26 NOTE — ED Triage Notes (Addendum)
Reports feeling "bad" all day.  Endorses some sob but denies having any pain.  Also reports some leg cramps usually in the morning.  Breathing easy and nonlabored in triage.  Noted to be in afib at this time.  Per patient has not history of afib.

## 2018-08-27 ENCOUNTER — Other Ambulatory Visit: Payer: Self-pay

## 2018-08-27 ENCOUNTER — Encounter (HOSPITAL_COMMUNITY): Payer: Self-pay

## 2018-08-27 ENCOUNTER — Observation Stay (HOSPITAL_BASED_OUTPATIENT_CLINIC_OR_DEPARTMENT_OTHER): Payer: Commercial Managed Care - PPO

## 2018-08-27 DIAGNOSIS — I4891 Unspecified atrial fibrillation: Secondary | ICD-10-CM | POA: Diagnosis not present

## 2018-08-27 DIAGNOSIS — R0789 Other chest pain: Secondary | ICD-10-CM | POA: Diagnosis not present

## 2018-08-27 DIAGNOSIS — R0683 Snoring: Secondary | ICD-10-CM | POA: Diagnosis present

## 2018-08-27 DIAGNOSIS — I129 Hypertensive chronic kidney disease with stage 1 through stage 4 chronic kidney disease, or unspecified chronic kidney disease: Secondary | ICD-10-CM | POA: Diagnosis not present

## 2018-08-27 DIAGNOSIS — R7989 Other specified abnormal findings of blood chemistry: Secondary | ICD-10-CM | POA: Diagnosis present

## 2018-08-27 DIAGNOSIS — E119 Type 2 diabetes mellitus without complications: Secondary | ICD-10-CM

## 2018-08-27 DIAGNOSIS — I10 Essential (primary) hypertension: Secondary | ICD-10-CM | POA: Diagnosis present

## 2018-08-27 DIAGNOSIS — N183 Chronic kidney disease, stage 3 unspecified: Secondary | ICD-10-CM

## 2018-08-27 DIAGNOSIS — I208 Other forms of angina pectoris: Secondary | ICD-10-CM | POA: Diagnosis present

## 2018-08-27 DIAGNOSIS — R0602 Shortness of breath: Secondary | ICD-10-CM | POA: Diagnosis not present

## 2018-08-27 DIAGNOSIS — D649 Anemia, unspecified: Secondary | ICD-10-CM | POA: Diagnosis present

## 2018-08-27 LAB — TSH: TSH: 0.021 u[IU]/mL — ABNORMAL LOW (ref 0.350–4.500)

## 2018-08-27 LAB — FERRITIN: Ferritin: 237 ng/mL (ref 24–336)

## 2018-08-27 LAB — IRON AND TIBC
IRON: 42 ug/dL — AB (ref 45–182)
SATURATION RATIOS: 14 % — AB (ref 17.9–39.5)
TIBC: 291 ug/dL (ref 250–450)
UIBC: 249 ug/dL

## 2018-08-27 LAB — LIPID PANEL
Cholesterol: 96 mg/dL (ref 0–200)
HDL: 33 mg/dL — AB (ref >40)
LDL Cholesterol: 53 mg/dL (ref 0–99)
TRIGLYCERIDES: 51 mg/dL (ref <150)
Total CHOL/HDL Ratio: 2.9 RATIO
VLDL: 10 mg/dL (ref 0–40)

## 2018-08-27 LAB — ECHOCARDIOGRAM COMPLETE
HEIGHTINCHES: 71 in
Weight: 3715.2 oz

## 2018-08-27 LAB — GLUCOSE, CAPILLARY: GLUCOSE-CAPILLARY: 135 mg/dL — AB (ref 70–99)

## 2018-08-27 LAB — HEMOGLOBIN A1C
Hgb A1c MFr Bld: 6.4 % — ABNORMAL HIGH (ref 4.8–5.6)
Mean Plasma Glucose: 136.98 mg/dL

## 2018-08-27 LAB — TROPONIN I

## 2018-08-27 LAB — BRAIN NATRIURETIC PEPTIDE: B Natriuretic Peptide: 318.6 pg/mL — ABNORMAL HIGH (ref 0.0–100.0)

## 2018-08-27 LAB — HIV ANTIBODY (ROUTINE TESTING W REFLEX): HIV Screen 4th Generation wRfx: NONREACTIVE

## 2018-08-27 LAB — HEPARIN LEVEL (UNFRACTIONATED): Heparin Unfractionated: 0.67 IU/mL (ref 0.30–0.70)

## 2018-08-27 MED ORDER — METOPROLOL TARTRATE 5 MG/5ML IV SOLN
5.0000 mg | Freq: Once | INTRAVENOUS | Status: AC
  Administered 2018-08-27: 5 mg via INTRAVENOUS
  Filled 2018-08-27: qty 5

## 2018-08-27 MED ORDER — SIMVASTATIN 10 MG PO TABS: 10.0000 mg | ORAL_TABLET | Freq: Every day | ORAL | Status: DC

## 2018-08-27 MED ORDER — RIVAROXABAN 20 MG PO TABS: 20.0000 mg | ORAL_TABLET | Freq: Every day | ORAL | 0 refills | Status: DC

## 2018-08-27 MED ORDER — OLOPATADINE HCL 0.1 % OP SOLN
1.0000 [drp] | Freq: Every day | OPHTHALMIC | Status: DC
  Filled 2018-08-27: qty 5

## 2018-08-27 MED ORDER — HEPARIN BOLUS VIA INFUSION
4000.0000 [IU] | Freq: Once | INTRAVENOUS | Status: AC
  Administered 2018-08-27: 4000 [IU] via INTRAVENOUS
  Filled 2018-08-27: qty 4000

## 2018-08-27 MED ORDER — METOPROLOL TARTRATE 25 MG PO TABS: 12.5000 mg | ORAL_TABLET | Freq: Two times a day (BID) | ORAL | 0 refills | Status: DC

## 2018-08-27 MED ORDER — DILTIAZEM HCL-DEXTROSE 100-5 MG/100ML-% IV SOLN (PREMIX)
5.0000 mg/h | INTRAVENOUS | Status: DC
  Filled 2018-08-27: qty 100

## 2018-08-27 MED ORDER — RIVAROXABAN 20 MG PO TABS
20.0000 mg | ORAL_TABLET | Freq: Every day | ORAL | Status: DC
  Filled 2018-08-27: qty 1

## 2018-08-27 MED ORDER — HEPARIN (PORCINE) IN NACL 100-0.45 UNIT/ML-% IJ SOLN
1600.0000 [IU]/h | INTRAMUSCULAR | Status: DC
  Administered 2018-08-27: 1600 [IU]/h via INTRAVENOUS
  Filled 2018-08-27 (×3): qty 250

## 2018-08-27 MED ORDER — ACETAMINOPHEN 325 MG PO TABS: 650.0000 mg | ORAL_TABLET | ORAL | Status: DC | PRN

## 2018-08-27 MED ORDER — INSULIN ASPART 100 UNIT/ML ~~LOC~~ SOLN: 0.0000 [IU] | Freq: Three times a day (TID) | SUBCUTANEOUS | Status: DC

## 2018-08-27 MED ORDER — ONDANSETRON HCL 4 MG/2ML IJ SOLN: 4.0000 mg | Freq: Four times a day (QID) | INTRAMUSCULAR | Status: DC | PRN

## 2018-08-27 MED ORDER — METOPROLOL TARTRATE 12.5 MG HALF TABLET
12.5000 mg | ORAL_TABLET | Freq: Two times a day (BID) | ORAL | Status: DC
  Filled 2018-08-27: qty 1

## 2018-08-27 NOTE — Progress Notes (Signed)
ANTICOAGULATION CONSULT NOTE - Initial Consult  Pharmacy Consult for heparin Indication: atrial fibrillation  Allergies  Allergen Reactions  . Pork-Derived Products Other (See Comments)    Patient preference    Patient Measurements: Height: 5\' 11"  (180.3 cm) Weight: 232 lb (105.2 kg) IBW/kg (Calculated) : 75.3 Heparin Dosing Weight: 100kg  Vital Signs: Temp: 97.7 F (36.5 C) (11/02 2239) Temp Source: Oral (11/02 2239) BP: 95/72 (11/03 0300) Pulse Rate: 111 (11/03 0300)  Labs: Recent Labs    08/26/18 2249 08/27/18 0020  HGB 11.5*  --   HCT 34.9*  --   PLT 250  --   CREATININE 1.52*  --   TROPONINI  --  <0.03    Estimated Creatinine Clearance: 56.6 mL/min (A) (by C-G formula based on SCr of 1.52 mg/dL (H)).   Medical History: Past Medical History:  Diagnosis Date  . Diabetes mellitus without complication ([place])   . Hypertension   . Prostate cancer [facility])     Assessment: 67yo male c/o feeling poor w/ some SOB, found to be in Afib w/ no known hx of Afib, to begin heparin.  Goal of Therapy:  Heparin level 0.3-0.7 units/ml Monitor platelets by anticoagulation protocol: Yes   Plan:  Will give heparin 4000 units IV bolus x1 followed by gtt at 1600 units/hr and monitor heparin levels and CBC.  Wynona Neat, PharmD, BCPS  08/27/2018,3:15 AM

## 2018-08-27 NOTE — Discharge Summary (Signed)
Physician Discharge Summary  Gibbs Naugle SJG:[id number] DOB: 1951/03/17 DOA: 08/26/2018  PCP: Nolene Ebbs, MD  Admit date: 08/26/2018 Discharge date: 08/27/2018  Admitted From: Home Disposition: Home  Recommendations for Outpatient Follow-up:  1. Follow up with PCP in 1 weeks to arrange outpatient sleep study for evaluation of likely sleep apnea 2. Please obtain BMP/CBC in one week 3. Keep appointment with cardiology office will call you to set it up 4. Patient with mild hyperthyroidism.  Would recommend recheck in the office and consideration of medication management given atrial fibrillation.  Home Health: No Equipment/Devices: None  Discharge Condition: Stable CODE STATUS: Full code Diet recommendation: Heart Healthy / Carb Modified   Brief/Interim Summary: Phillip Williams is a 67 y.o. male with medical history significant of hypertension, hyperlipidemia, type II diabetes, CKD 3, prostate cancer presenting to the hospital complaining of fatigue, chest pressure, and dyspnea.  Patient states he was at work yesterday afternoon and was feeling tired.  He works as a Geophysicist/field seismologist for [employer].  At work, while walking he experienced substernal chest pressure, shortness of breath, and nausea which resolved after he rested for 5 minutes.  States symptoms recurred several times yesterday only when he was walking or climbing stairs and resolved with rest each time.  He is not sure if he was having heart palpitations.  No associated diaphoresis.  Denies having any symptoms at present. On arrival, afebrile, pulse 108, respiratory rate 18, blood pressure 103/72, and SPO2 98% on room air.  No leukocytosis.  Hemoglobin 11.5, no recent baseline.  I-STAT troponin negative.  BNP 318.  Chest x-ray with no focal infiltrate or overt edema.  EKG showing A. fib with RVR (ventricular rate 125) .  Patient received IV metoprolol 5 mg once in the ED and heart rate improved to 90s to 100s.  However, blood pressure  dropped, as such, no further doses of metoprolol were given.  He was seen by cardiology who is considering echocardiogram and possible cardioversion in the a.m. given his low blood pressure however, patient went into in NSR, normal rate shortly after noon today. Will dismiss home on metoprolol tartrate 12.5 mg BID, and rivaroxaban 20 mg daily. Will have fu scheduled in 2-4 weeks in [facility] cardiology clinic.  Patient's TSH was noted to be abnormally low and would recommend repeat as an outpatient prior to starting anti-thyroid therapy. He needs a sleep evaluation as an outpatient. An office visit with pro=imary has been requested. Patient has reached maximal benefit of hospitalization.  Discharge diagnosis, prognosis, plans, follow-up, medications and treatments discussed with the patient(or responsible party) and is in agreement with the plans as described.  Patient is stable for discharge.  Discharge Diagnoses:  Principal Problem:   New onset a-fib [facility]) Active Problems:   Snoring   Abnormal serum thyroid stimulating hormone (TSH) level   Stable angina (HCC)   Anemia   HTN (hypertension)   Type 2 diabetes mellitus (HCC)   CKD (chronic kidney disease) stage 3, GFR 30-59 ml/min [facility])    Discharge Instructions  Discharge Instructions    Diet - low sodium heart healthy   Complete by:  As directed    Increase activity slowly   Complete by:  As directed      Allergies as of 08/27/2018      Reactions   Pork-derived Products Other (See Comments)   Patient preference      Medication List    STOP taking these medications   Olmesartan-amLODIPine-HCTZ 40-10-25 MG Tabs  TAKE these medications   metFORMIN 500 MG tablet Commonly known as:  GLUCOPHAGE Take 500 mg by mouth 2 (two) times daily with a meal.   metoprolol tartrate 25 MG tablet Commonly known as:  LOPRESSOR Take 0.5 tablets (12.5 mg total) by mouth 2 (two) times daily.   PAZEO 0.7 % Soln Generic drug:  Olopatadine  HCl Place 1 drop into both eyes at bedtime.   rivaroxaban 20 MG Tabs tablet Commonly known as:  XARELTO Take 1 tablet (20 mg total) by mouth daily.   simvastatin 10 MG tablet Commonly known as:  ZOCOR Take 10 mg by mouth at bedtime.      Follow-up Information    Elouise Munroe, MD Follow up.   Specialty:  Cardiology Why:  Office will call to arrange followup appointment Contact information: [place] [address] [phone]        Nolene Ebbs, MD Follow up in 1 week(s).   Specialty:  Internal Medicine Why:  Needs evaluation for Sleep Apnea.Marland Kitchen THIS IS VERY IMPORTANT to make and keep this appointment Contact information: [address] [place] [address] [phone]          Allergies  Allergen Reactions  . Pork-Derived Products Other (See Comments)    Patient preference    Consultations:  Dr. Warren Danes [facility] cardiology   Procedures/Studies: Dg Chest 2 View  Result Date: 08/26/2018 CLINICAL DATA:  Shortness of breath for 1 day EXAM: CHEST - 2 VIEW COMPARISON:  Mar 07, 2017 FINDINGS: The heart, hila, and mediastinum are normal. No pulmonary nodules or masses. No focal infiltrates or overt edema. IMPRESSION: No active cardiopulmonary disease. Electronically Signed   By: Dorise Bullion III M.D   On: 08/26/2018 23:54   Echocardiogram showed: - Left ventricle: The cavity size was normal. There was mild   concentric hypertrophy. Systolic function was normal. The   estimated ejection fraction was in the range of 55% to 60%. Wall   motion was normal; there were no regional wall motion   abnormalities. - Aortic valve: There was trivial regurgitation. - Left atrium: The atrium was mildly dilated. - Right atrium: The atrium was mildly dilated.   Subjective: Patient feeling better and anxious to discharge home.  Discharge Exam: Vitals:   08/27/18 0315 08/27/18 0335  BP: 93/62 (!) 114/97  Pulse: 99 (!) 114  Resp: (!) 21  18  Temp:  (!) 97.4 F (36.3 C)  SpO2: 98% 99%   Vitals:   08/27/18 0215 08/27/18 0300 08/27/18 0315 08/27/18 0335  BP: 91/65 95/72 93/62  (!) 114/97  Pulse: 95 (!) 111 99 (!) 114  Resp: (!) 22 (!) 25 (!) 21 18  Temp:    (!) 97.4 F (36.3 C)  TempSrc:    Oral  SpO2: 97% 96% 98% 99%  Weight:    105.3 kg  Height:    5\' 11"  (1.803 m)    General: Pt is alert, awake, not in acute distress Cardiovascular: RRR, S1/S2 +, no rubs, no gallops Respiratory: CTA bilaterally, no wheezing, no rhonchi Abdominal: Soft, NT, ND, bowel sounds + Extremities: no edema, no cyanosis    The results of significant diagnostics from this hospitalization (including imaging, microbiology, ancillary and laboratory) are listed below for reference.      Labs: BNP (last 3 results) Recent Labs    08/27/18 0020  BNP 258.5*   Basic Metabolic Panel: Recent Labs  Lab 08/26/18 2249  NA 138  K 3.7  CL  100  CO2 28  GLUCOSE 126*  BUN 21  CREATININE 1.52*  CALCIUM 10.2   CBC: Recent Labs  Lab 08/26/18 2249  WBC 5.5  HGB 11.5*  HCT 34.9*  MCV 85.7  PLT 250   Cardiac Enzymes: Recent Labs  Lab 08/27/18 0020  TROPONINI <0.03   CBG: Recent Labs  Lab 08/27/18 1211  GLUCAP 135*   Hgb A1c Recent Labs    08/27/18 0319  HGBA1C 6.4*   Lipid Profile Recent Labs    08/27/18 0329  CHOL 96  HDL 33*  LDLCALC 53  TRIG 51  CHOLHDL 2.9   Thyroid function studies Recent Labs    08/27/18 0319  TSH 0.021*   Anemia work up Recent Labs    08/27/18 0319  FERRITIN 237  TIBC 291  IRON 42*     Time coordinating discharge: 43 minutes  SIGNED:   Lady Deutscher, MD  FACP [facility] 08/27/2018, 2:00 PM Pager   If 7PM-7AM, please contact night-coverage [email or web address] Password [email or web address]

## 2018-08-27 NOTE — Progress Notes (Signed)
  Echocardiogram 2D Echocardiogram has been performed.  Javad Salva L Androw 08/27/2018, 8:29 AM

## 2018-08-27 NOTE — Progress Notes (Signed)
Per Dr Margaretann Loveless requested f/u 2-4 weeks. I have sent a message to our office's scheduling team requesting a follow-up appointment, and our office will call the patient with this information.  Dayna Dunn PA-C

## 2018-08-27 NOTE — Progress Notes (Signed)
ANTICOAGULATION CONSULT NOTE - Initial Consult  Pharmacy Consult for heparin Indication: atrial fibrillation  Allergies  Allergen Reactions  . Pork-Derived Products Other (See Comments)    Patient preference    Patient Measurements: Height: 5\' 11"  (180.3 cm) Weight: 232 lb 3.2 oz (105.3 kg)(scale a) IBW/kg (Calculated) : 75.3 Heparin Dosing Weight: 100kg  Vital Signs: Temp: 97.4 F (36.3 C) (11/03 0335) Temp Source: Oral (11/03 0335) BP: 114/97 (11/03 0335) Pulse Rate: 114 (11/03 0335)  Labs: Recent Labs    08/26/18 2249 08/27/18 0020 08/27/18 1106  HGB 11.5*  --   --   HCT 34.9*  --   --   PLT 250  --   --   HEPARINUNFRC  --   --  0.67  CREATININE 1.52*  --   --   TROPONINI  --  <0.03  --     Estimated Creatinine Clearance: 56.6 mL/min (A) (by C-G formula based on SCr of 1.52 mg/dL (H)).   Medical History: Past Medical History:  Diagnosis Date  . Diabetes mellitus without complication ([place])   . Hypertension   . Prostate cancer [facility])     Assessment: 67yo male c/o feeling poor w/ some SOB, found to be in Afib w/ no known hx of Afib, started on heparin drip. Initial heparin level 0.67, within goal range. No documented bleeding.  Goal of Therapy:  Heparin level 0.3-0.7 units/ml Monitor platelets by anticoagulation protocol: Yes   Plan:  -Continue heparin gtt at 1600 units/hr  -Confirmatory 6 hour heparin level -Daily heparin level and CBC.  Harrietta Guardian, PharmD PGY1 Pharmacy Resident 08/27/2018    12:06 PM

## 2018-08-27 NOTE — H&P (Signed)
History and Physical    Phillip Williams XOV:[id number] DOB: [birth year] DOA: 08/26/2018  PCP: Nolene Ebbs, MD Patient coming from: Home  Chief Complaint: Fatigue, chest pressure, dyspnea  HPI: Phillip Williams is a 67 y.o. male with medical history significant of hypertension, hyperlipidemia, type II diabetes, CKD 3, prostate cancer presenting to the hospital complaining of fatigue, chest pressure, and dyspnea.  Patient states he was at work yesterday afternoon and was feeling tired.  He works as a Geophysicist/field seismologist for [employer].  At work, while walking he experienced substernal chest pressure, shortness of breath, and nausea which resolved after he rested for 5 minutes.  States symptoms recurred several times yesterday only when he was walking or climbing stairs and resolved with rest each time.  He is not sure if he was having heart palpitations.  No associated diaphoresis.  Denies having any symptoms at present.  ED Course: On arrival, afebrile, pulse 108, respiratory rate 18, blood pressure 103/72, and SPO2 98% on room air.  No leukocytosis.  Hemoglobin 11.5, no recent baseline.  I-STAT troponin negative.  BNP 318.  Chest x-ray with no focal infiltrate or overt edema.  EKG showing A. fib with RVR (ventricular rate 125) disease.  Patient received IV metoprolol 5 mg once in the ED and heart rate improved to 90s to 100s.  However, blood pressure dropped, as such, no further doses of metoprolol were given. [facility] paged to admit.   Review of Systems: As per HPI otherwise 10 point review of systems negative.  Past Medical History:  Diagnosis Date  . Diabetes mellitus without complication ([place])   . Hypertension   . Prostate cancer [facility])     Past Surgical History:  Procedure Laterality Date  . LYMPHADENECTOMY Bilateral 03/17/2017   Procedure: PELVIC LYMPHADENECTOMY;  Surgeon: Raynelle Bring, MD;  Location: [facility] ORS;  Service: Urology;  Laterality: Bilateral;  . PROSTATE BIOPSY    . ROBOT ASSISTED LAPAROSCOPIC  RADICAL PROSTATECTOMY N/A 03/17/2017   Procedure: XI ROBOTIC ASSISTED LAPAROSCOPIC RADICAL PROSTATECTOMY LEVEL 2;  Surgeon: Raynelle Bring, MD;  Location: [facility] ORS;  Service: Urology;  Laterality: N/A;     reports that he has never smoked. He has never used smokeless tobacco. He reports that he does not drink alcohol or use drugs.  Allergies  Allergen Reactions  . Pork-Derived Products Other (See Comments)    Patient preference    Family history Patient denies family history of coronary artery disease.  Prior to Admission medications   Medication Sig Start Date End Date Taking? Authorizing Provider  metFORMIN (GLUCOPHAGE) 500 MG tablet Take 500 mg by mouth 2 (two) times daily with a meal.   Yes [provider]  Olmesartan-Amlodipine-HCTZ 40-10-25 MG TABS Take 1 capsule by mouth every evening. 02/09/17  Yes [provider]  PAZEO 0.7 % SOLN Place 1 drop into both eyes at bedtime. 02/09/17  Yes [provider]  simvastatin (ZOCOR) 10 MG tablet Take 10 mg by mouth at bedtime. 08/06/18  Yes [provider]    Physical Exam: Vitals:   08/27/18 0145 08/27/18 0215 08/27/18 0300 08/27/18 0315  BP: 96/67 91/65 95/72  93/62  Pulse: 99 95 (!) 111 99  Resp: (!) 22 (!) 22 (!) 25 (!) 21  Temp:      TempSrc:      SpO2: 99% 97% 96% 98%  Weight:      Height:        Physical Exam  Constitutional: He is oriented to person, place, and time. He appears  well-developed and well-nourished. No distress.  HENT:  Head: Normocephalic.  Mouth/Throat: Oropharynx is clear and moist.  Eyes: Right eye exhibits no discharge. Left eye exhibits no discharge.  Neck: Neck supple. No tracheal deviation present.  Cardiovascular: Intact distal pulses.  Heart rate in the 90s to low 100s Irregularly irregular rhythm  Pulmonary/Chest: Effort normal and breath sounds normal. No respiratory distress. He has no wheezes. He has no rales.  Abdominal: Soft. Bowel sounds are normal. He  exhibits no distension. There is no tenderness.  Musculoskeletal: He exhibits no edema.  Neurological: He is alert and oriented to person, place, and time.  Skin: Skin is warm and dry. He is not diaphoretic.  Psychiatric: He has a normal mood and affect. His behavior is normal.     Labs on Admission: I have personally reviewed following labs and imaging studies  CBC: Recent Labs  Lab 08/26/18 2249  WBC 5.5  HGB 11.5*  HCT 34.9*  MCV 85.7  PLT 841   Basic Metabolic Panel: Recent Labs  Lab 08/26/18 2249  NA 138  K 3.7  CL 100  CO2 28  GLUCOSE 126*  BUN 21  CREATININE 1.52*  CALCIUM 10.2   GFR: Estimated Creatinine Clearance: 56.6 mL/min (A) (by C-G formula based on SCr of 1.52 mg/dL (H)). Liver Function Tests: No results for input(s): AST, ALT, ALKPHOS, BILITOT, PROT, ALBUMIN in the last 168 hours. No results for input(s): LIPASE, AMYLASE in the last 168 hours. No results for input(s): AMMONIA in the last 168 hours. Coagulation Profile: No results for input(s): INR, PROTIME in the last 168 hours. Cardiac Enzymes: Recent Labs  Lab 08/27/18 0020  TROPONINI <0.03   BNP (last 3 results) No results for input(s): PROBNP in the last 8760 hours. HbA1C: No results for input(s): HGBA1C in the last 72 hours. CBG: No results for input(s): GLUCAP in the last 168 hours. Lipid Profile: No results for input(s): CHOL, HDL, LDLCALC, TRIG, CHOLHDL, LDLDIRECT in the last 72 hours. Thyroid Function Tests: No results for input(s): TSH, T4TOTAL, FREET4, T3FREE, THYROIDAB in the last 72 hours. Anemia Panel: No results for input(s): VITAMINB12, FOLATE, FERRITIN, TIBC, IRON, RETICCTPCT in the last 72 hours. Urine analysis: No results found for: COLORURINE, APPEARANCEUR, LABSPEC, [place], GLUCOSEU, HGBUR, BILIRUBINUR, KETONESUR, PROTEINUR, UROBILINOGEN, NITRITE, LEUKOCYTESUR  Radiological Exams on Admission: Dg Chest 2 View  Result Date: 08/26/2018 CLINICAL DATA:  Shortness of  breath for 1 day EXAM: CHEST - 2 VIEW COMPARISON:  Mar 07, 2017 FINDINGS: The heart, hila, and mediastinum are normal. No pulmonary nodules or masses. No focal infiltrates or overt edema. IMPRESSION: No active cardiopulmonary disease. Electronically Signed   By: Dorise Bullion III M.D   On: 08/26/2018 23:54    EKG: Independently reviewed.  A. fib with RVR (ventricular rate 125).   Assessment/Plan Principal Problem:   New onset a-fib ([place]) Active Problems:   Stable angina (HCC)   Anemia   HTN (hypertension)   Type 2 diabetes mellitus (HCC)   CKD (chronic kidney disease) stage 3, GFR 30-59 ml/min (HCC)   New onset atrial fibrillation Patient is presenting with 1 day history of fatigue, substernal chest pressure, dyspnea, and nausea associated with exertion.  Symptoms resolved each time with rest within 5 mins.  He is not sure if he was having heart palpitations.  EKG showing A. fib with RVR (ventricular rate 125).  He received 1 dose of IV metoprolol 5 mg in the ED. Heart rate now improved to 90s to low  100s but continues to be in A. fib.  Unable to tolerate any further doses of beta-blocker/ CCB due to drop in BP.  Currently maintaining map in the 8s.  BNP 318, however, chest x-ray with no overt edema.  Satting well on room air. -Monitor on telemetry -Echocardiogram to look for valvular etiology of A. fib -CHA2DS2VASc 3. Started on Heparin drip at this time.  Discuss oral anticoagulation options after echocardiogram. -Consult cardiology in the morning -Check TSH -Repeat EKG in a.m. -Zofran PRN nausea -Supplemental oxygen if needed  Stable angina Although chest pressure could be related to atrial fibrillation.  I-STAT troponin negative and EKG not suggestive of ACS.  He does have risk factors for coronary artery disease including gender, age, hypertension, hyperlipidemia, and diabetes.  Currently chest pain-free. -Monitor on telemetry -Echocardiogram as above -Consult cardiology in the  morning -Check A1c, lipid panel  Anemia -Hemoglobin 11.5 with normal MCV.  No recent baseline. -Check iron, ferritin, TIBC -CBC in a.m.  Hypertension -Hold home olmesartan-amlodipine-hydrochlorothiazide in the setting of soft blood pressure readings.  Hyperlipidemia -Continue home Zocor -Check lipid panel  Type 2 diabetes Blood glucose 126 on admission. -Check A1c -Sliding scale insulin sensitive -CBG checks  CKD 3 -Stable.  Creatinine 1.5 and GFR 52.  Labs from a year ago showing creatinine 1.4 and GFR 58.  DVT prophylaxis: Heparin Code Status: Patient wishes to be full code. Family Communication: No family present at bedside. Disposition Plan: Anticipate discharge to home in 1 to 2 days. Consults called: None Admission status: Observation  Shela Leff MD [facility] Pager [phone]  If 7PM-7AM, please contact night-coverage [email or web address] Password [email or web address]  08/27/2018, 3:26 AM

## 2018-08-27 NOTE — Consult Note (Addendum)
Cardiology Consultation:   Patient ID: Phillip Williams MRN: [id number]; DOB: September 23, 1951  Admit date: 08/26/2018 Date of Consult: 08/27/2018  Primary Care Provider: Nolene Ebbs, MD Primary Cardiologist: New to [facility]   Patient Profile:   Phillip Williams is a 67 y.o. male with a hx of HTN, HLD, DM, CKD stage III and prostate cancer who is being seen today for the evaluation of atrial fibrillation with RVR at the request of Dr. Evangeline Gula.   History of Present Illness:   Phillip Williams was in [place] upuntil 11am yesterday when had sudden onset SOB, chest pain and extreme fatigue at work. He works as Geophysicist/field seismologist. Symptoms resolved by it self in 5 minutes but reoccurred (worse) leading to ER presentation. Denies radiation of pain, nausea, vomiting and palpitation. EKG showed afib RVR. Given IV metoprolol 5mg  with improved rate but dropped blood pressure. Admitted for further evaluation. HR remained in 110-140s. Soft BP. Held home olmesartan-Amlodipine- HCTZ. Denies prior symptoms. No syncope, dizziness, orthopnea, PND, LE edema or melena.   Denies tobacco abuse or illicit drug. Occasional drinking. Denies family hx of CAD. No regular exercise.   Past Medical History:  Diagnosis Date  . Diabetes mellitus without complication ([place])   . Hypertension   . Prostate cancer [facility])     Past Surgical History:  Procedure Laterality Date  . LYMPHADENECTOMY Bilateral 03/17/2017   Procedure: PELVIC LYMPHADENECTOMY;  Surgeon: Raynelle Bring, MD;  Location: [facility];  Service: Urology;  Laterality: Bilateral;  . PROSTATE BIOPSY    . ROBOT ASSISTED LAPAROSCOPIC RADICAL PROSTATECTOMY N/A 03/17/2017   Procedure: XI ROBOTIC ASSISTED LAPAROSCOPIC RADICAL PROSTATECTOMY LEVEL 2;  Surgeon: Raynelle Bring, MD;  [facility];  Service: Urology;  Laterality: N/A;   Inpatient Medications: Scheduled Meds: . insulin aspart  0-9 Units Subcutaneous TID WC  . olopatadine  1 drop Both Eyes QHS  . simvastatin  10 mg Oral  QHS   Continuous Infusions: . heparin 1,600 Units/hr (08/27/18 0401)   PRN Meds: acetaminophen, ondansetron (ZOFRAN) IV  Allergies:    Allergies  Allergen Reactions  . Pork-Derived Products Other (See Comments)    Patient preference    Social History:   Social History   Socioeconomic History  . Marital status: Married    Spouse name: Not on file  . Number of children: Not on file  . Years of education: Not on file  . Highest education level: Not on file  Occupational History  . Not on file  Social Needs  . Financial resource strain: Not on file  . Food insecurity:    Worry: Not on file    Inability: Not on file  . Transportation needs:    Medical: Not on file    Non-medical: Not on file  Tobacco Use  . Smoking status: Never Smoker  . Smokeless tobacco: Never Used  Substance and Sexual Activity  . Alcohol use: No  . Drug use: No  . Sexual activity: Not on file  Lifestyle  . Physical activity:    Days per week: Not on file    Minutes per session: Not on file  . Stress: Not on file  Relationships  . Social connections:    Talks on phone: Not on file    Gets together: Not on file    Attends religious service: Not on file    Active member of club or organization: Not on file    Attends meetings of clubs or organizations: Not on file    Relationship status: Not on  file  . Intimate partner violence:    Fear of current or ex partner: Not on file    Emotionally abused: Not on file    Physically abused: Not on file    Forced sexual activity: Not on file  Other Topics Concern  . Not on file  Social History Narrative  . Not on file    Family History:   No family history on file.   ROS:  Please see the history of present illness.  All other ROS reviewed and negative.     Physical Exam/Data:   Vitals:   08/27/18 0215 08/27/18 0300 08/27/18 0315 08/27/18 0335  BP: 91/65 95/72 93/62  (!) 114/97  Pulse: 95 (!) 111 99 (!) 114  Resp: (!) 22 (!) 25 (!) 21 18    Temp:    (!) 97.4 F (36.3 C)  TempSrc:    Oral  SpO2: 97% 96% 98% 99%  Weight:    105.3 kg  Height:    5\' 11"  (1.803 m)    Intake/Output Summary (Last 24 hours) at 08/27/2018 0749 Last data filed at 08/27/2018 0659 Gross per 24 hour  Intake 0.38 ml  Output -  Net 0.38 ml   Filed Weights   08/26/18 2244 08/27/18 0335  Weight: 105.2 kg 105.3 kg   Body mass index is 32.39 kg/m.  General:  Well nourished, well developed, in no acute distress HEENT: normal Lymph: no adenopathy Neck: no JVD Endocrine:  No thryomegaly Vascular: No carotid bruits; FA pulses 2+ bilaterally without bruits  Cardiac:  normal S1, S2; IR IR with tachycardia  no murmur  Lungs:  clear to auscultation bilaterally, no wheezing, rhonchi or rales  Abd: soft, nontender, no hepatomegaly  Ext: no edema Musculoskeletal:  No deformities, BUE and BLE strength normal and equal Skin: warm and dry  Neuro:  CNs 2-12 intact, no focal abnormalities noted Psych:  Normal affect   EKG:  The EKG was personally reviewed and demonstrates:  afib at rate of 125bpm Telemetry:  Telemetry was personally reviewed and demonstrates:  Atrial fibrillation at rate of 110-140s  Relevant CV Studies: None Pending echocardiogram   Laboratory Data:  Chemistry Recent Labs  Lab 08/26/18 2249  NA 138  K 3.7  CL 100  CO2 28  GLUCOSE 126*  BUN 21  CREATININE 1.52*  CALCIUM 10.2  GFRNONAA 45*  GFRAA 52*  ANIONGAP 10    No results for input(s): PROT, ALBUMIN, AST, ALT, ALKPHOS, BILITOT in the last 168 hours. Hematology Recent Labs  Lab 08/26/18 2249  WBC 5.5  RBC 4.07*  HGB 11.5*  HCT 34.9*  MCV 85.7  MCH 28.3  MCHC 33.0  RDW 14.6  PLT 250   Cardiac Enzymes Recent Labs  Lab 08/27/18 0020  TROPONINI <0.03   No results for input(s): TROPIPOC in the last 168 hours.  BNP Recent Labs  Lab 08/27/18 0020  BNP 318.6*    Radiology/Studies:  Dg Chest 2 View  Result Date: 08/26/2018 CLINICAL DATA:  Shortness of  breath for 1 day EXAM: CHEST - 2 VIEW COMPARISON:  Mar 07, 2017 FINDINGS: The heart, hila, and mediastinum are normal. No pulmonary nodules or masses. No focal infiltrates or overt edema. IMPRESSION: No active cardiopulmonary disease. Electronically Signed   By: Dorise Bullion III M.D   On: 08/26/2018 23:54    Assessment and Plan:   1. Atrial fibrillation with RVR - New onset likely yesterday at 11 am. His symptoms were acute SOB, chest pain and  extreme fatigue. Never had palpitation. Become hypotensive after IV metoprolol 5mg  x1. Now rate of 110-140s. TSH low. Pending echo. CHADVASC score of 3 for age, [facility] and DM. On heparin. Switch to NOAC at discharge.  - will review rate control/antiarrhymiac with MD. TEE DCCV Monday vs DCCV after 3-4 weeks of anticoagulation depending on hospital course.   2. HTN - home olmesartan-Amlodipine- HCTZ on hold  3. Low TSH - Per primary team  4. HTN - continue statin.  5. DM - A1c 6.4. Metformin on hold. On SSI.   6. Chest pain - Likely rate related. Troponin negative x1. Pending echo.   For questions or updates, please contact [place] Please consult [email or web address] for contact info under     SignedLeanor Kail, PA  08/27/2018 7:49 AM  ---------------------------------------------------------------------------------------------   History and all data above reviewed.  Patient examined.  I agree with the findings as above.  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 He was found to be in afib RVR.   Constitutional: No acute distress Eyes: pupils equally round and reactive to light, sclera non-icteric, normal conjunctiva and lids ENMT: normal dentition, moist mucous membranes Cardiovascular: irregular rhythm, tachycardic, no murmurs. Radial pulses normal bilaterally. No jugular venous distention.  Respiratory: fine crackles left lung base. GI : normal bowel  sounds, soft and nontender. No distention.   MSK: extremities warm, well perfused. No edema.  NEURO: grossly nonfocal exam, moves all extremities. PSYCH: alert and oriented x 3, normal mood and affect.   All available labs, radiology testing, previous records reviewed. Agree with documented assessment and plan of my colleague as stated above with the following additions or changes:  Principal Problem:   New onset a-fib ([place]) Active Problems:   Stable angina (HCC)   Anemia   HTN (hypertension)   Type 2 diabetes mellitus (HCC)   CKD (chronic kidney disease) stage 3, GFR 30-59 ml/min (HCC)    Plan:  afib RVR - will initiate diltiazem infusion. He is currently on heparin. CHADS2VASC score of 3 (age, htn, dm2). Will plan for TEE CV tomorrow. Will dismiss on DOAC.   I have personally reviewed his echocardiogram images. LVEF is preserved and no apparent regionals though difficult to assess with rapid rate.   Snoring - he snores, and requires an outpatient evaluation for sleep apnea. He does not think he has sleep apnea, but has never been checked.  Hypothyroid - per IM.   ADDENDUM - patient in NSR, normal rate. Will dismiss home on metoprolol tartrate 12.5 mg BID, and rivaroxaban 20 mg daily. Will have fu scheduled in 2-4 weeks in [facility] cardiology clinic. He needs a sleep evaluation as an outpatient.   Elouise Munroe, MD [facility] 12:20 PM  08/27/2018

## 2018-08-27 NOTE — ED Notes (Addendum)
Patient's blood pressure 84/66, RN advised MD and received verbal orders for NS 500 cc, bolus, IV.

## 2018-08-27 NOTE — Plan of Care (Signed)
  Problem: Clinical Measurements: Goal: Cardiovascular complication will be avoided Outcome: Not Progressing   Problem: Health Behavior/Discharge Planning: Goal: Ability to manage health-related needs will improve Outcome: Progressing

## 2018-08-27 NOTE — Progress Notes (Signed)
Pt appears to have converted into NSR. EKG done. Cardizem on hold for now. Awaiting call back from cardiology.

## 2018-08-27 NOTE — Care Management Note (Signed)
Case Management Note  Patient Details  Name: Phillip Williams MRN: [id number] Date of Birth: 06/29/1951  Subjective/Objective:                    Action/Plan:  Provided with 30 day [place] card. No other CM needs identified. Expected Discharge Date:  08/27/18               Expected Discharge Plan:  Home/Self Care  In-House Referral:     Discharge planning Services  CM Consult, Medication Assistance  Post Acute Care Choice:    Choice offered to:     DME Arranged:    DME Agency:     HH Arranged:    HH Agency:     Status of Service:  Completed, signed off  If discussed at [employer] of Stay Meetings, dates discussed:    Additional Comments:  Phillip Collet, RN 08/27/2018, 2:26 PM

## 2018-08-27 NOTE — ED Provider Notes (Signed)
[place] EMERGENCY DEPARTMENT Provider Note   CSN: [id number] Arrival date & time: 08/26/18  2234     History   Chief Complaint Chief Complaint  Patient presents with  . Shortness of Breath  . Atrial Fibrillation    HPI Phillip Williams is a 67 y.o. male.  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  He has never had afib but has been dx with DM and Htn and Prostate CA - he is originally from [place] but has been in [place] for 20 or more years  His SOB and weakness is persistesnt, mild but worse with exertion - PCP is Dr. Jeanie Cooks.  Past Medical History:  Diagnosis Date  . Diabetes mellitus without complication ([place])   . Hypertension   . Prostate cancer [facility])     Patient Active Problem List   Diagnosis Date Noted  . New onset a-fib ([place]) 08/27/2018  . Prostate cancer ([place]) 03/17/2017  . Malignant neoplasm of prostate ([place]) 02/07/2017    Past Surgical History:  Procedure Laterality Date  . LYMPHADENECTOMY Bilateral 03/17/2017   Procedure: PELVIC LYMPHADENECTOMY;  Surgeon: Raynelle Bring, MD;  [facility];  Service: Urology;  Laterality: Bilateral;  . PROSTATE BIOPSY    . ROBOT ASSISTED LAPAROSCOPIC RADICAL PROSTATECTOMY N/A 03/17/2017   Procedure: XI ROBOTIC ASSISTED LAPAROSCOPIC RADICAL PROSTATECTOMY LEVEL 2;  Surgeon: Raynelle Bring, MD;  [facility];  Service: Urology;  Laterality: N/A;        Home Medications    Prior to Admission medications   Medication Sig Start Date End Date Taking? Authorizing Provider  metFORMIN (GLUCOPHAGE) 500 MG tablet Take 500 mg  by mouth 2 (two) times daily with a meal.   Yes [provider]  Olmesartan-Amlodipine-HCTZ 40-10-25 MG TABS Take 1 capsule by mouth every evening. 02/09/17  Yes [provider]  PAZEO 0.7 % SOLN Place 1 drop into both eyes at bedtime. 02/09/17  Yes [provider]  simvastatin (ZOCOR) 10 MG tablet Take 10 mg by mouth at bedtime. 08/06/18  Yes [provider]    Family History No family history on file.  Social History Social History   Tobacco Use  . Smoking status: Never Smoker  . Smokeless tobacco: Never Used  Substance Use Topics  . Alcohol use: No  . Drug use: No     Allergies   Pork-derived products   Review of Systems Review of Systems  All other systems reviewed and are negative.    Physical Exam Updated Vital Signs BP 96/67   Pulse 99   Temp 97.7 F (36.5 C) (Oral)   Resp (!) 22   Ht 1.803 m (5\' 11" )   Wt 105.2 kg   SpO2 99%   BMI 32.36 kg/m   Physical Exam  Constitutional: He appears well-developed and well-nourished. No distress.  HENT:  Head: Normocephalic and atraumatic.  Mouth/Throat: Oropharynx is clear and moist. No oropharyngeal exudate.  Eyes: Pupils are equal, round, and reactive to light. Conjunctivae and EOM are normal. Right eye exhibits  no discharge. Left eye exhibits no discharge. No scleral icterus.  Neck: Normal range of motion. Neck supple. No JVD present. No thyromegaly present.  Cardiovascular: Normal heart sounds and intact distal pulses. Exam reveals no gallop and no friction rub.  No murmur heard. Mild tachycardia in rapid afib, normal pulses at radial arteries  Pulmonary/Chest: Effort normal and breath sounds normal. No respiratory distress. He has no wheezes. He has no rales.  Clear lungs normal WOB  Abdominal: Soft. Bowel sounds are normal. He exhibits no distension and no mass. There is no tenderness.  Musculoskeletal: Normal range of motion. He exhibits edema ( \mild bilateral symmetrical  edema of the legs). He exhibits no tenderness.  Lymphadenopathy:    He has no cervical adenopathy.  Neurological: He is alert. Coordination normal.  Skin: Skin is warm and dry. No rash noted. No erythema.  Psychiatric: He has a normal mood and affect. His behavior is normal.  Nursing note and vitals reviewed.    ED Treatments / Results  Labs (all labs ordered are listed, but only abnormal results are displayed) Labs Reviewed  CBC - Abnormal; Notable for the following components:      Result Value   RBC 4.07 (*)    Hemoglobin 11.5 (*)    HCT 34.9 (*)    All other components within normal limits  BASIC METABOLIC PANEL - Abnormal; Notable for the following components:   Glucose, Bld 126 (*)    Creatinine, Ser 1.52 (*)    GFR calc non Af Amer 45 (*)    GFR calc Af Amer 52 (*)    All other components within normal limits  BRAIN NATRIURETIC PEPTIDE - Abnormal; Notable for the following components:   B Natriuretic Peptide 318.6 (*)    All other components within normal limits  TROPONIN I    EKG EKG Interpretation  Date/Time:  Saturday August 26 2018 22:40:25 EDT Ventricular Rate:  125 PR Interval:    QRS Duration: 92 QT Interval:  350 QTC Calculation: 505 R Axis:   42 Text Interpretation:  Atrial fibrillation with rapid ventricular response with premature ventricular or aberrantly conducted complexes Abnormal ECG No old tracing to compare Confirmed by Noemi Chapel [phone]) on 08/26/2018 11:32:21 PM   Radiology Dg Chest 2 View  Result Date: 08/26/2018 CLINICAL DATA:  Shortness of breath for 1 day EXAM: CHEST - 2 VIEW COMPARISON:  Mar 07, 2017 FINDINGS: The heart, hila, and mediastinum are normal. No pulmonary nodules or masses. No focal infiltrates or overt edema. IMPRESSION: No active cardiopulmonary disease. Electronically Signed   By: Dorise Bullion III M.D   On: 08/26/2018 23:54    Procedures Procedures (including critical care time)  Medications Ordered in  ED Medications  metoprolol tartrate (LOPRESSOR) injection 5 mg (5 mg Intravenous Given 08/27/18 0049)     Initial Impression / Assessment and Plan / ED Course  I have reviewed the triage vital signs and the nursing notes.  Pertinent labs & imaging results that were available during my care of the patient were reviewed by me and considered in my medical decision making (see chart for details).  Clinical Course as of Aug 27 214  Nancy Fetter Aug 27, 2018  0212 The patient is in continual atrial fibrillation, he did require fluid resuscitation for some hypotension associated with the intravenous beta-blocker that was given.  This improved his blood pressure, his troponin is negative, metabolic panel is at baseline including his creatinine and CBC is unremarkable.  The patient  will be admitted to the hospital.  I discussed his care with the hospitalist at 2:13 AM.   [BM]    Clinical Course User Index [BM] Noemi Chapel, MD     Pt gets points on [place] for Age - 1, Htn - 1, and DM - 1 for a score of 3.  The pt is an anticoagulation candidate   Final Clinical Impressions(s) / ED Diagnoses   Final diagnoses:  Atrial fibrillation with rapid ventricular response (HCC)      Noemi Chapel, MD 08/27/18 [phone]

## 2018-10-02 NOTE — Progress Notes (Signed)
Cardiology Office Note   Date:  10/03/2018   ID:  Phillip Williams, DOB [birth year], MRN [id number]  PCP:  Phillip Ebbs, MD  Cardiologist: Dr.  Margaretann Williams Chief Complaint  Patient presents with  . Atrial Fibrillation  . Hospitalization Follow-up     History of Present Illness: Phillip Williams is a 67 y.o. male who presents for post hospital followed after being seen on consultation for atrial fib with RVR, with know history of HTN, HLD, DM type II, CKD Stage III, and prostate cancer.  He presented to the ER with atrial fib with RVR, and was given metoprolol 5 mg IV. Heart rate improved slightly but he became hypotensive. CHADS VASC Score of 3. His home olmestartan-amlodipine and HCTZ was placed on hold.   He comes today feeling well. He has seen his PCP this am and lab work was drawn. He states he was eating a lot of "bitternut coca nuts" at home, which are apparently full of caffeine. He remains on metoprolol 12.5 mg BID.    He converted to NSR on his own the following day. He was discharged on metoprolol 12.5 mg BID, and Xarelto 20 mg daily. He was found to have abnormally low TSH and was recommended to have this rechecked on outpatient follow up. Prior antihypertensives were not restarted.   Past Medical History:  Diagnosis Date  . Diabetes mellitus without complication ([place])   . Hypertension   . Prostate cancer [facility])     Past Surgical History:  Procedure Laterality Date  . LYMPHADENECTOMY Bilateral 03/17/2017   Procedure: PELVIC LYMPHADENECTOMY;  Surgeon: Phillip Bring, MD;  [facility];  Service: Urology;  Laterality: Bilateral;  . PROSTATE BIOPSY    . ROBOT ASSISTED LAPAROSCOPIC RADICAL PROSTATECTOMY N/A 03/17/2017   Procedure: XI ROBOTIC ASSISTED LAPAROSCOPIC RADICAL PROSTATECTOMY LEVEL 2;  Surgeon: Phillip Bring, MD;  [facility];  Service: Urology;  Laterality: N/A;     Current Outpatient Medications  Medication Sig Dispense Refill  . metFORMIN (GLUCOPHAGE)  500 MG tablet Take 500 mg by mouth 2 (two) times daily with a meal.    . metoprolol tartrate (LOPRESSOR) 25 MG tablet Take 0.5 tablets (12.5 mg total) by mouth 2 (two) times daily. 30 tablet 11  . PAZEO 0.7 % SOLN Place 1 drop into both eyes at bedtime.  1  . rivaroxaban (XARELTO) 20 MG TABS tablet Take 1 tablet (20 mg total) by mouth daily. 30 tablet 11  . simvastatin (ZOCOR) 10 MG tablet Take 10 mg by mouth at bedtime.  2   No current facility-administered medications for this visit.     Allergies:   Pork-derived products    Social History:  The patient  reports that he has never smoked. He has never used smokeless tobacco. He reports that he does not drink alcohol or use drugs.   Family History:  The patient's family history is not on file.    ROS: All other systems are reviewed and negative. Unless otherwise mentioned in H&P    PHYSICAL EXAM: VS:  BP 96/68   Pulse 72   Ht 5\' 11"  (1.803 m)   Wt 231 lb 12.8 oz (105.1 kg)   BMI 32.33 kg/m  , BMI Body mass index is 32.33 kg/m. GEN: Well nourished, well developed, in no acute distress HEENT: normal Neck: no JVD, carotid bruits, or masses Cardiac: RRR; no murmurs, rubs, or gallops,no edema  Respiratory:  Clear to auscultation bilaterally, normal work of breathing GI: soft, nontender, nondistended, + BS.  Central obesity.  MS: no deformity or atrophy Skin: warm and dry, no rash Neuro:  Strength and sensation are intact Psych: euthymic mood, full affect   EKG:  NSR rate of 72 bpm. Non-specific T-wave abnormality.   Recent Labs: 08/26/2018: BUN 21; Creatinine, Ser 1.52; Hemoglobin 11.5; Platelets 250; Potassium 3.7; Sodium 138 09-25-18: B Natriuretic Peptide 318.6; TSH 0.021    Lipid Panel    Component Value Date/Time   CHOL 96 25-Sep-2018 0329   TRIG 51 09/25/18 0329   HDL 33 (L) Sep 25, 2018 0329   CHOLHDL 2.9 09/25/18 0329   VLDL 10 2018/09/25 0329   LDLCALC 53 Sep 25, 2018 0329      Wt Readings from Last 3  Encounters:  10/03/18 231 lb 12.8 oz (105.1 kg)  09/25/18 232 lb 3.2 oz (105.3 kg)  02/23/18 234 lb (106.1 kg)      Other studies Reviewed: Echocardiogram 09-25-2018 Left ventricle: The cavity size was normal. There was mild   concentric hypertrophy. Systolic function was normal. The   estimated ejection fraction was in the range of 55% to 60%. Wall   motion was normal; there were no regional wall motion   abnormalities. - Aortic valve: There was trivial regurgitation. - Left atrium: The atrium was mildly dilated. - Right atrium: The atrium was mildly dilated.  ASSESSMENT AND PLAN:  1. PAF: He is doing much better now. His labs were ordered by PCP to include TSH, earlier today. He remains on metoprolol 12.5 mg BID. He is hypotensive but asymptomatic. Would like to get him off of the BB if possible to prevent further hypotension if he remains in NSR.Will await labs concerning TSH. He is to avoid excessive caffeine. Echo is reassuring.   2.  Hypotension: Was taken off of ARB/HCTZ/amlodipine combination medications. Now on low dose BB.    Current medicines are reviewed at length with the patient today.    Labs/ tests ordered today include: None   Phill Williams. Phillip Williams, ANP, AACC   10/03/2018 1:24 PM    [place] [address] Office [phone] Fax [phone]

## 2018-10-03 ENCOUNTER — Ambulatory Visit (INDEPENDENT_AMBULATORY_CARE_PROVIDER_SITE_OTHER): Payer: Commercial Managed Care - PPO | Admitting: Adult Health

## 2018-10-03 ENCOUNTER — Encounter: Payer: Self-pay | Admitting: Adult Health

## 2018-10-03 VITALS — BP 96/68 | HR 72 | Ht 71.0 in | Wt 231.8 lb

## 2018-10-03 DIAGNOSIS — I480 Paroxysmal atrial fibrillation: Secondary | ICD-10-CM | POA: Diagnosis not present

## 2018-10-03 DIAGNOSIS — I952 Hypotension due to drugs: Secondary | ICD-10-CM

## 2018-10-03 MED ORDER — METOPROLOL TARTRATE 25 MG PO TABS: 12.5000 mg | ORAL_TABLET | Freq: Two times a day (BID) | ORAL | 11 refills | Status: DC

## 2018-10-03 MED ORDER — RIVAROXABAN 20 MG PO TABS: 20.0000 mg | ORAL_TABLET | Freq: Every day | ORAL | 11 refills | Status: DC

## 2018-10-03 NOTE — Patient Instructions (Signed)
Medication Instructions:  Continue current medications If you need a refill on your cardiac medications before your next appointment, please call your pharmacy.   Lab work: NONE If you have labs (blood work) drawn today and your tests are completely normal, you will receive your results only by: Marland Kitchen MyChart Message (if you have MyChart) OR . A paper copy in the mail If you have any lab test that is abnormal or we need to change your treatment, we will call you to review the results.  Testing/Procedures: NONE  Follow-Up: At [facility], you and your health needs are our priority.  As part of our continuing mission to provide you with exceptional heart care, we have created designated Provider Care Teams.  These Care Teams include your primary Cardiologist (physician) and Advanced Practice Providers (APPs -  Physician Assistants and Nurse Practitioners) who all work together to provide you with the care you need, when you need it. You will need a follow up appointment in 3 months. You may see Dr. Margaretann Loveless or one of the following Advanced Practice Providers on your designated Care Team:   Rosaria Ferries, PA-C . Jory Sims, DNP, ANP  Any Other Special Instructions Will Be Listed Below (If Applicable).

## 2018-12-18 ENCOUNTER — Other Ambulatory Visit: Payer: Self-pay

## 2018-12-18 ENCOUNTER — Emergency Department (HOSPITAL_COMMUNITY)
Admission: EM | Admit: 2018-12-18 | Discharge: 2018-12-19 | Disposition: A | Discharge status: Home / Self Care | Payer: Commercial Managed Care - PPO | Attending: Emergency Medicine | Admitting: Emergency Medicine

## 2018-12-18 ENCOUNTER — Encounter (HOSPITAL_COMMUNITY): Payer: Self-pay

## 2018-12-18 DIAGNOSIS — E876 Hypokalemia: Secondary | ICD-10-CM | POA: Diagnosis not present

## 2018-12-18 DIAGNOSIS — Z79899 Other long term (current) drug therapy: Secondary | ICD-10-CM | POA: Insufficient documentation

## 2018-12-18 DIAGNOSIS — E860 Dehydration: Secondary | ICD-10-CM | POA: Diagnosis not present

## 2018-12-18 DIAGNOSIS — Z7984 Long term (current) use of oral hypoglycemic drugs: Secondary | ICD-10-CM | POA: Diagnosis not present

## 2018-12-18 DIAGNOSIS — N183 Chronic kidney disease, stage 3 (moderate): Secondary | ICD-10-CM | POA: Insufficient documentation

## 2018-12-18 DIAGNOSIS — R55 Syncope and collapse: Secondary | ICD-10-CM

## 2018-12-18 DIAGNOSIS — I129 Hypertensive chronic kidney disease with stage 1 through stage 4 chronic kidney disease, or unspecified chronic kidney disease: Secondary | ICD-10-CM | POA: Insufficient documentation

## 2018-12-18 DIAGNOSIS — E1122 Type 2 diabetes mellitus with diabetic chronic kidney disease: Secondary | ICD-10-CM | POA: Insufficient documentation

## 2018-12-18 DIAGNOSIS — Z7901 Long term (current) use of anticoagulants: Secondary | ICD-10-CM | POA: Insufficient documentation

## 2018-12-18 HISTORY — DX: Unspecified atrial fibrillation: I48.91

## 2018-12-18 LAB — CBC
HCT: 40.8 % (ref 39.0–52.0)
HEMOGLOBIN: 13.2 g/dL (ref 13.0–17.0)
MCH: 27.3 pg (ref 26.0–34.0)
MCHC: 32.4 g/dL (ref 30.0–36.0)
MCV: 84.5 fL (ref 80.0–100.0)
NRBC: 0 % (ref 0.0–0.2)
Platelets: 290 10*3/uL (ref 150–400)
RBC: 4.83 MIL/uL (ref 4.22–5.81)
RDW: 15.6 % — ABNORMAL HIGH (ref 11.5–15.5)
WBC: 10.5 10*3/uL (ref 4.0–10.5)

## 2018-12-18 LAB — URINALYSIS, MICROSCOPIC (REFLEX)

## 2018-12-18 LAB — BASIC METABOLIC PANEL
ANION GAP: 15 (ref 5–15)
BUN: 21 mg/dL (ref 8–23)
CALCIUM: 9.8 mg/dL (ref 8.9–10.3)
CHLORIDE: 97 mmol/L — AB (ref 98–111)
CO2: 22 mmol/L (ref 22–32)
Creatinine, Ser: 1.71 mg/dL — ABNORMAL HIGH (ref 0.61–1.24)
GFR calc non Af Amer: 40 mL/min — ABNORMAL LOW (ref >60)
GFR, EST AFRICAN AMERICAN: 46 mL/min — AB (ref >60)
GLUCOSE: 175 mg/dL — AB (ref 70–99)
POTASSIUM: 3.1 mmol/L — AB (ref 3.5–5.1)
Sodium: 134 mmol/L — ABNORMAL LOW (ref 135–145)

## 2018-12-18 LAB — URINALYSIS, ROUTINE W REFLEX MICROSCOPIC
GLUCOSE, UA: NEGATIVE mg/dL
Hgb urine dipstick: NEGATIVE
Ketones, ur: 15 mg/dL — AB
LEUKOCYTE UA: NEGATIVE
Nitrite: NEGATIVE
PROTEIN: 30 mg/dL — AB
pH: 5 (ref 5.0–8.0)

## 2018-12-18 LAB — CBG MONITORING, ED: GLUCOSE-CAPILLARY: 120 mg/dL — AB (ref 70–99)

## 2018-12-18 MED ORDER — POTASSIUM CHLORIDE CRYS ER 20 MEQ PO TBCR
40.0000 meq | EXTENDED_RELEASE_TABLET | Freq: Once | ORAL | Status: AC
  Administered 2018-12-18: 40 meq via ORAL
  Filled 2018-12-18: qty 2

## 2018-12-18 MED ORDER — SODIUM CHLORIDE 0.9% FLUSH: 3.0000 mL | Freq: Once | INTRAVENOUS | Status: DC

## 2018-12-18 MED ORDER — SODIUM CHLORIDE 0.9 % IV BOLUS
1000.0000 mL | Freq: Once | INTRAVENOUS | Status: AC
  Administered 2018-12-18: 1000 mL via INTRAVENOUS

## 2018-12-18 NOTE — ED Triage Notes (Signed)
Pt here by EMS for a near syncopal episode.  Pt reports dizziness prior to event.  Did not pass out.  EMS CBG 171.  Pt A&Ox4 and ambulatory to triage.

## 2018-12-18 NOTE — ED Provider Notes (Signed)
[facility] EMERGENCY DEPARTMENT Provider Note   CSN: [id number] Arrival date & time: 12/18/18  1927    History   Chief Complaint Chief Complaint  Patient presents with  . Near Syncope    HPI Phillip Williams is a 68 y.o. male.     HPI   Phillip Williams is a 68 y.o. male, with a history of DM, HTN, and A. fib, presenting to the ED with a near syncopal episode that occurred shortly prior to arrival.  Patient [place] [employer], had just dropped off a passenger, bent over, stood up quickly, and felt "not well" and like he might fall over. States, "It felt like I had low blood sugar." He sat down and felt better, drank a soda, and felt normal after about 10-15 minutes. Last dose of metoprolol was this morning. Last food was around 9 AM this morning. No medication changes.  Denies fever/chills, recent illness, cough, chest pain, shortness of breath, abdominal pain, N/V/D, urinary symptoms, fall, headache, neuro deficits, or any other complaints.    Past Medical History:  Diagnosis Date  . A-fib ([place])    Anticoagulated on Xarelto  . Diabetes mellitus without complication ([place])   . Hypertension   . Prostate cancer [facility])     Patient Active Problem List   Diagnosis Date Noted  . New onset a-fib ([place]) 08/27/2018  . Stable angina ([place]) 08/27/2018  . Anemia 08/27/2018  . HTN (hypertension) 08/27/2018  . Type 2 diabetes mellitus ([place]) 08/27/2018  . CKD (chronic kidney disease) stage 3, GFR 30-59 ml/min ([facility]) 08/27/2018  . Snoring 08/27/2018  . Abnormal serum thyroid stimulating hormone (TSH) level 08/27/2018  . Prostate cancer ([place]) 03/17/2017  . Malignant neoplasm of prostate ([place]) 02/07/2017    Past Surgical History:  Procedure Laterality Date  . LYMPHADENECTOMY Bilateral 03/17/2017   Procedure: PELVIC LYMPHADENECTOMY;  Surgeon: Raynelle Bring, MD;  Location: [facility] ORS;  Service: Urology;  Laterality: Bilateral;  . PROSTATE BIOPSY    . ROBOT ASSISTED  LAPAROSCOPIC RADICAL PROSTATECTOMY N/A 03/17/2017   Procedure: XI ROBOTIC ASSISTED LAPAROSCOPIC RADICAL PROSTATECTOMY LEVEL 2;  Surgeon: Raynelle Bring, MD;  Location: [facility];  Service: Urology;  Laterality: N/A;        Home Medications    Prior to Admission medications   Medication Sig Start Date End Date Taking? Authorizing Provider  Aspirin-Salicylamide-Caffeine (ARTHRITIS STRENGTH BC POWDER PO) Take 1 packet by mouth daily as needed (pain).   Yes [provider]  Ibuprofen-diphenhydrAMINE Cit (ADVIL PM PO) Take 1 tablet by mouth at bedtime as needed (sleep).   Yes [provider]  metFORMIN (GLUCOPHAGE) 500 MG tablet Take 500 mg by mouth 2 (two) times daily with a meal.   Yes [provider]  metoprolol tartrate (LOPRESSOR) 25 MG tablet Take 0.5 tablets (12.5 mg total) by mouth 2 (two) times daily. 10/03/18  Yes Lendon Colonel, NP  Multiple Vitamin (MULTIVITAMIN WITH MINERALS) TABS tablet Take 1 tablet by mouth daily. One a Day Men's   Yes [provider]  Olmesartan-amLODIPine-HCTZ (TRIBENZOR) 40-10-25 MG TABS Take 1 tablet by mouth at bedtime.   Yes [provider]  Olopatadine HCl (PAZEO) 0.7 % SOLN Place 1 drop into both eyes at bedtime.   Yes [provider]  rivaroxaban (XARELTO) 20 MG TABS tablet Take 1 tablet (20 mg total) by mouth daily. Patient taking differently: Take 20 mg by mouth at bedtime.  10/03/18  Yes Lendon Colonel, NP  potassium chloride (K-DUR) 10  MEQ tablet Take 1 tablet (10 mEq total) by mouth 2 (two) times daily for 7 days. 12/19/18 12/26/18  Lorayne Bender, PA-C    Family History History reviewed. No pertinent family history.  Social History Social History   Tobacco Use  . Smoking status: Never Smoker  . Smokeless tobacco: Never Used  Substance Use Topics  . Alcohol use: No  . Drug use: No     Allergies   Pork-derived products   Review of Systems Review of Systems  Constitutional:  Negative for chills, diaphoresis and fever.  Respiratory: Negative for shortness of breath.   Cardiovascular: Negative for chest pain.  Gastrointestinal: Negative for abdominal pain, diarrhea, nausea and vomiting.  Neurological: Positive for syncope (Near syncope) and light-headedness (resolved). Negative for weakness, numbness and headaches.  All other systems reviewed and are negative.    Physical Exam Updated Vital Signs BP 100/71   Pulse 62   Temp (!) 97.4 F (36.3 C) (Oral)   Resp 16   SpO2 98%   Physical Exam Vitals signs and nursing note reviewed.  Constitutional:      General: He is not in acute distress.    Appearance: He is well-developed. He is not diaphoretic.  HENT:     Head: Normocephalic and atraumatic.     Nose: Nose normal.     Mouth/Throat:     Mouth: Mucous membranes are moist.     Pharynx: Oropharynx is clear.  Eyes:     Extraocular Movements: Extraocular movements intact.     Conjunctiva/sclera: Conjunctivae normal.     Pupils: Pupils are equal, round, and reactive to light.  Neck:     Musculoskeletal: Neck supple.  Cardiovascular:     Rate and Rhythm: Normal rate and regular rhythm.     Pulses: Normal pulses.     Heart sounds: Normal heart sounds.  Pulmonary:     Effort: Pulmonary effort is normal. No respiratory distress.     Breath sounds: Normal breath sounds.  Abdominal:     Palpations: Abdomen is soft.     Tenderness: There is no abdominal tenderness. There is no guarding.  Musculoskeletal:     Right lower leg: No edema.     Left lower leg: No edema.  Lymphadenopathy:     Cervical: No cervical adenopathy.  Skin:    General: Skin is warm and dry.  Neurological:     General: No focal deficit present.     Mental Status: He is alert and oriented to person, place, and time.     Comments: Sensation grossly intact to light touch in the extremities. Strength 5/5 in all extremities. No gait disturbance. Coordination intact. Cranial nerves  III-XII grossly intact. No facial droop.   Psychiatric:        Mood and Affect: Mood and affect normal.        Speech: Speech normal.        Behavior: Behavior normal.      ED Treatments / Results  Labs (all labs ordered are listed, but only abnormal results are displayed) Labs Reviewed  BASIC METABOLIC PANEL - Abnormal; Notable for the following components:      Result Value   Sodium 134 (*)    Potassium 3.1 (*)    Chloride 97 (*)    Glucose, Bld 175 (*)    Creatinine, Ser 1.71 (*)    GFR calc non Af Amer 40 (*)    GFR calc Af Amer 46 (*)    All  other components within normal limits  CBC - Abnormal; Notable for the following components:   RDW 15.6 (*)    All other components within normal limits  URINALYSIS, ROUTINE W REFLEX MICROSCOPIC - Abnormal; Notable for the following components:   Specific Gravity, Urine >1.030 (*)    Bilirubin Urine MODERATE (*)    Ketones, ur 15 (*)    Protein, ur 30 (*)    All other components within normal limits  URINALYSIS, MICROSCOPIC (REFLEX) - Abnormal; Notable for the following components:   Bacteria, UA RARE (*)    All other components within normal limits  CBG MONITORING, ED - Abnormal; Notable for the following components:   Glucose-Capillary 120 (*)    All other components within normal limits    EKG EKG Interpretation  Date/Time:  Monday December 18 2018 19:28:48 EST Ventricular Rate:  73 PR Interval:  162 QRS Duration: 100 QT Interval:  422 QTC Calculation: 464 R Axis:   -13 Text Interpretation:  Normal sinus rhythm Nonspecific ST and T wave abnormality Abnormal ECG No significant change since last tracing Confirmed by Orpah Greek [id number]) on 12/18/2018 11:23:57 PM   Radiology No results found.  Procedures Procedures (including critical care time)  Medications Ordered in ED Medications  sodium chloride 0.9 % bolus 1,000 mL (0 mLs Intravenous Stopped 12/18/18 2327)  potassium chloride SA (K-DUR,KLOR-CON) CR  tablet 40 mEq (40 mEq Oral Given 12/18/18 2229)     Initial Impression / Assessment and Plan / ED Course  I have reviewed the triage vital signs and the nursing notes.  Pertinent labs & imaging results that were available during my care of the patient were reviewed by me and considered in my medical decision making (see chart for details).  Clinical Course as of Dec 19 137  Tue Dec 19, 2018  0025 Patient states he feels "great."  He has been up to the bathroom without difficulty or onset of symptoms.   [SJ]    Clinical Course User Index [SJ] ,  C, PA-C       Patient presents with a near syncopal episode.  Initially with some hypotension documented before my evaluation the patient.  Suspect dehydration with a small increase in creatinine, mild decreases in sodium and chloride, mild hypokalemia, and ketonuria.  Patient symptom-free upon my initial evaluation and did not have recurrence of his symptoms during his ED course.  My suspicion for cardiac cause for patient's symptoms is quite low as the history is not suggestive.  Advised PCP follow-up. The patient was given instructions for home care as well as return precautions. Patient voices understanding of these instructions, accepts the plan, and is comfortable with discharge.  Findings and plan of care discussed with Malachy Moan, MD. Dr. Betsey Holiday personally evaluated and examined this patient.  Vitals:   12/18/18 2230 12/18/18 2245 12/18/18 2300 12/18/18 2315  BP: 98/72 104/75 109/78 115/86  Pulse: 71 67  65  Resp: 16 16    Temp:      TempSrc:      SpO2: 98% 98% 97% 99%   Orthostatic VS for the past 24 hrs:  BP- Lying Pulse- Lying BP- Sitting Pulse- Sitting BP- Standing at 0 minutes Pulse- Standing at 0 minutes  12/19/18 0111 104/66 70 100/62 73 104/68 85     Final Clinical Impressions(s) / ED Diagnoses   Final diagnoses:  Dehydration  Near syncope  Hypokalemia    ED Discharge Orders  Ordered     potassium chloride (K-DUR) 10 MEQ tablet  2 times daily     12/19/18 0116           Lorayne Bender, PA-C 12/19/18 0140    Orpah Greek, MD 12/19/18 [phone]

## 2018-12-18 NOTE — ED Notes (Signed)
Pt given coffee and snack given

## 2018-12-19 MED ORDER — POTASSIUM CHLORIDE ER 10 MEQ PO TBCR: 10.0000 meq | EXTENDED_RELEASE_TABLET | Freq: Two times a day (BID) | ORAL | 0 refills | Status: DC

## 2018-12-19 NOTE — ED Notes (Signed)
Pt discharged from ED; instructions provided and scripts given; Pt encouraged to return to ED if symptoms worsen and to f/u with PCP; Pt verbalized understanding of all instructions 

## 2018-12-19 NOTE — Discharge Instructions (Addendum)
Dehydration  We suspect you may be dehydrated.  Dehydration typically causes its own symptoms including lightheadedness, nausea, headaches, fatigue increased thirst, and generally feeling unwell. Drink plenty of fluids and get plenty of rest. You should be drinking at least half a liter of water every hour or two to stay hydrated. Electrolyte drinks (ex. Gatorade, Powerade, Pedialyte) are also encouraged. You should be drinking enough fluids to make your urine light yellow, almost clear. If this is not the case, you are not drinking enough water.  It is recommended that you eat regular, small meals throughout the day and have snacks on hand.  Follow-up with your primary care provider to evaluate whether or not your blood pressure medications are still dosed appropriately.  It was also noted your potassium was lower than normal.  Take the potassium supplement, as prescribed.  Follow-up with your primary care provider to have the potassium retested in the next week or two.  Return to the emergency department for recurrence of symptoms, chest pain, shortness of breath, or any other major concerns.

## 2019-01-09 ENCOUNTER — Ambulatory Visit: Payer: Commercial Managed Care - PPO | Admitting: Internal Medicine

## 2019-01-22 ENCOUNTER — Telehealth: Payer: Self-pay | Admitting: *Deleted

## 2019-01-22 NOTE — Telephone Encounter (Signed)
   Primary Cardiologist:  Dr Margaretann Loveless   Patient contacted.  History reviewed.  No symptoms to suggest any unstable cardiac conditions.  Based on discussion, with current pandemic situation, we will be postponing this appointment for Phillip Williams with a plan for f/u in >12 weeks  wks or sooner if feasible/necessary.  If symptoms change, he has been instructed to contact our office.   Routing to C19 CANCEL pool for tracking Phillip Simmonds, RN  01/22/2019 10:24 AM         .

## 2019-01-25 ENCOUNTER — Ambulatory Visit: Payer: Commercial Managed Care - PPO | Admitting: Internal Medicine

## 2019-02-21 NOTE — Telephone Encounter (Signed)
CALLED PT HE STATES THAT HE WOULD LIKE A TELEPHONE VISIT AS HE DOES NOT KNOW HOW TO DO A VIDEO VISIT ON HIS PHONE.  YOUR CARDIOLOGY TEAM HAS ARRANGED FOR AN E-VISIT FOR YOUR APPOINTMENT - PLEASE REVIEW IMPORTANT INFORMATION BELOW SEVERAL DAYS PRIOR TO YOUR APPOINTMENT  Due to the recent COVID-19 pandemic, we are transitioning in-person office visits to tele-medicine visits in an effort to decrease unnecessary exposure to our patients, their families, and staff. These visits are billed to your insurance just like a normal visit is. We also encourage you to sign up for MyChart if you have not already done so. You will need a smartphone if possible. For patients that do not have this, we can still complete the visit using a regular telephone but do prefer a smartphone to enable video when possible. You may have a family member that lives with you that can help. If possible, we also ask that you have a blood pressure cuff and scale at home to measure your blood pressure, heart rate and weight prior to your scheduled appointment. Patients with clinical needs that need an in-person evaluation and testing will still be able to come to the office if absolutely necessary. If you have any questions, feel free to call our office.    2-3 DAYS BEFORE YOUR APPOINTMENT  You will receive a telephone call from one of our [facility] team members - your caller ID may say "Unknown caller." If this is a video visit, we will walk you through how to get the video launched on your phone. We will remind you check your blood pressure, heart rate and weight prior to your scheduled appointment. If you have an Apple Watch or Kardia, please upload any pertinent ECG strips the day before or morning of your appointment to [facility]. Our staff will also make sure you have reviewed the consent and agree to move forward with your scheduled tele-health visit.     THE DAY OF YOUR APPOINTMENT  Approximately 15 minutes prior to your  scheduled appointment, you will receive a telephone call from one of [facility] team - your caller ID may say "Unknown caller."  Our staff will confirm medications, vital signs for the day and any symptoms you may be experiencing. Please have this information available prior to the time of visit start. It may also be helpful for you to have a pad of paper and pen handy for any instructions given during your visit. They will also walk you through joining the smartphone meeting if this is a video visit.    CONSENT FOR TELE-HEALTH VISIT - PLEASE REVIEW  I hereby voluntarily request, consent and authorize [facility] and its employed or contracted physicians, physician assistants, nurse practitioners or other licensed health care professionals (the Practitioner), to provide me with telemedicine health care services (the "Services") as deemed necessary by the treating Practitioner. I acknowledge and consent to receive the Services by the Practitioner via telemedicine. I understand that the telemedicine visit will involve communicating with the Practitioner through live audiovisual communication technology and the disclosure of certain medical information by electronic transmission. I acknowledge that I have been given the opportunity to request an in-person assessment or other available alternative prior to the telemedicine visit and am voluntarily participating in the telemedicine visit.  I understand that I have the right to withhold or withdraw my consent to the use of telemedicine in the course of my care at any time, without affecting my right to future care or  treatment, and that the Practitioner or I may terminate the telemedicine visit at any time. I understand that I have the right to inspect all information obtained and/or recorded in the course of the telemedicine visit and may receive copies of available information for a reasonable fee.  I understand that some of the potential risks of receiving  the Services via telemedicine include:  Marland Kitchen Delay or interruption in medical evaluation due to technological equipment failure or disruption; . Information transmitted may not be sufficient (e.g. poor resolution of images) to allow for appropriate medical decision making by the Practitioner; and/or  . In rare instances, security protocols could fail, causing a breach of personal health information.  Furthermore, I acknowledge that it is my responsibility to provide information about my medical history, conditions and care that is complete and accurate to the best of my ability. I acknowledge that Practitioner's advice, recommendations, and/or decision may be based on factors not within their control, such as incomplete or inaccurate data provided by me or distortions of diagnostic images or specimens that may result from electronic transmissions. I understand that the practice of medicine is not an exact science and that Practitioner makes no warranties or guarantees regarding treatment outcomes. I acknowledge that I will receive a copy of this consent concurrently upon execution via email to the email address I last provided but may also request a printed copy by calling the office of [facility].    I understand that my insurance will be billed for this visit.   I have read or had this consent read to me. . I understand the contents of this consent, which adequately explains the benefits and risks of the Services being provided via telemedicine.  . I have been provided ample opportunity to ask questions regarding this consent and the Services and have had my questions answered to my satisfaction. . I give my informed consent for the services to be provided through the use of telemedicine in my medical care  By participating in this telemedicine visit I agree to the above.

## 2019-03-08 ENCOUNTER — Telehealth (INDEPENDENT_AMBULATORY_CARE_PROVIDER_SITE_OTHER): Payer: Commercial Managed Care - PPO | Admitting: Internal Medicine

## 2019-03-08 ENCOUNTER — Encounter: Payer: Self-pay | Admitting: Internal Medicine

## 2019-03-08 ENCOUNTER — Telehealth: Payer: Self-pay | Admitting: Internal Medicine

## 2019-03-08 VITALS — Ht 71.0 in

## 2019-03-08 DIAGNOSIS — I10 Essential (primary) hypertension: Secondary | ICD-10-CM

## 2019-03-08 DIAGNOSIS — I480 Paroxysmal atrial fibrillation: Secondary | ICD-10-CM

## 2019-03-08 DIAGNOSIS — N183 Chronic kidney disease, stage 3 unspecified: Secondary | ICD-10-CM

## 2019-03-08 DIAGNOSIS — E1122 Type 2 diabetes mellitus with diabetic chronic kidney disease: Secondary | ICD-10-CM

## 2019-03-08 DIAGNOSIS — R7989 Other specified abnormal findings of blood chemistry: Secondary | ICD-10-CM

## 2019-03-08 NOTE — Telephone Encounter (Signed)
Reached pt and ready for virtual visit with Dr. Margaretann Loveless. Visit completed.

## 2019-03-08 NOTE — Telephone Encounter (Signed)
New message   Patient is returning call that he missed about his virtual visit appt.at 9:00am. Please return call.

## 2019-03-08 NOTE — Progress Notes (Signed)
Virtual Visit via Telephone Note   This visit type was conducted due to national recommendations for restrictions regarding the COVID-19 Pandemic (e.g. social distancing) in an effort to limit this patient's exposure and mitigate transmission in our community.  Due to his co-morbid illnesses, this patient is at least at moderate risk for complications without adequate follow up.  This format is felt to be most appropriate for this patient at this time.  The patient did not have access to video technology/had technical difficulties with video requiring transitioning to audio format only (telephone).  All issues noted in this document were discussed and addressed.  No physical exam could be performed with this format.  Please refer to the patient's chart for his  consent to telehealth for [facility].   Date:  03/08/2019   ID:  Phillip Williams, DOB Oct 22, 1951, MRN [id number]  Patient Location: Home Provider Location: Home  PCP:  Nolene Ebbs, MD  Cardiologist:  Elouise Munroe, MD  Electrophysiologist:  None   Evaluation Performed:  Follow-Up Visit  Chief Complaint:  F/u paroxysmal afib  History of Present Illness:    Phillip Williams is a 68 y.o. male with history significant ofhypertension,hyperlipidemia,type II diabetes, CKD 3, prostate cancer, who presents for follow up today for an episode of Afib with self conversion. He endorses afib occurred in the setting of eating bitter cocoa, which he has since stopped. He has had no recurrence of symptoms. He feels his blood pressure runs low, 115/70 mmHg usually, but is asymptomatic. He continues on combination therapy with olmesartan-amlodipine-HCTZ and is tolerating this well.   He has discontinued metoprolol, and is no longer taking Xarelto. We have participated in shared decision making. CHADS2VASC score is 3, and we discussed this today.   The patient denies chest pain, chest pressure, dyspnea at rest or with exertion,  palpitations, PND, orthopnea, or leg swelling. Denies syncope or presyncope. Denies dizziness or lightheadedness. Denies snoring and has not been evaluated for sleep apnea.  The patient does not have symptoms concerning for COVID-19 infection (fever, chills, cough, or new shortness of breath).    Past Medical History:  Diagnosis Date  . A-fib ([place])    Anticoagulated on Xarelto  . Diabetes mellitus without complication ([place])   . Hypertension   . Prostate cancer [facility])    Past Surgical History:  Procedure Laterality Date  . LYMPHADENECTOMY Bilateral 03/17/2017   Procedure: PELVIC LYMPHADENECTOMY;  Surgeon: Raynelle Bring, MD;  Location: [facility];  Service: Urology;  Laterality: Bilateral;  . PROSTATE BIOPSY    . ROBOT ASSISTED LAPAROSCOPIC RADICAL PROSTATECTOMY N/A 03/17/2017   Procedure: XI ROBOTIC ASSISTED LAPAROSCOPIC RADICAL PROSTATECTOMY LEVEL 2;  Surgeon: Raynelle Bring, MD;  Location: [facility];  Service: Urology;  Laterality: N/A;     Current Meds  Medication Sig  . Aspirin-Salicylamide-Caffeine (ARTHRITIS STRENGTH BC POWDER PO) Take 1 packet by mouth daily as needed (pain).  . Ibuprofen-diphenhydrAMINE Cit (ADVIL PM PO) Take 1 tablet by mouth at bedtime as needed (sleep).  . metFORMIN (GLUCOPHAGE) 500 MG tablet Take 500 mg by mouth 2 (two) times daily with a meal.  . Multiple Vitamin (MULTIVITAMIN WITH MINERALS) TABS tablet Take 1 tablet by mouth daily. One a Day Men's  . Olmesartan-amLODIPine-HCTZ (TRIBENZOR) 40-10-25 MG TABS Take 1 tablet by mouth at bedtime.  . Olopatadine HCl (PAZEO) 0.7 % SOLN Place 1 drop into both eyes at bedtime.  . [DISCONTINUED] metoprolol tartrate (LOPRESSOR) 25 MG tablet Take 0.5 tablets (12.5 mg total) by mouth  2 (two) times daily.     Allergies:   Pork-derived products   Social History   Tobacco Use  . Smoking status: Never Smoker  . Smokeless tobacco: Never Used  Substance Use Topics  . Alcohol use: No  . Drug use: No     Family Hx: The  patient's family history is not on file.  ROS:   Please see the history of present illness.     All other systems reviewed and are negative.   Prior CV studies:   The following studies were reviewed today: No new  Labs/Other Tests and Data Reviewed:    EKG:  An ECG dated 12/19/2018 was personally reviewed today and demonstrated:  NSR, nonspecific ST T wave abnormality  Recent Labs: 08/27/2018: B Natriuretic Peptide 318.6; TSH 0.021 12/18/2018: BUN 21; Creatinine, Ser 1.71; Hemoglobin 13.2; Platelets 290; Potassium 3.1; Sodium 134   Recent Lipid Panel Lab Results  Component Value Date/Time   CHOL 96 08/27/2018 03:29 AM   TRIG 51 08/27/2018 03:29 AM   HDL 33 (L) 08/27/2018 03:29 AM   CHOLHDL 2.9 08/27/2018 03:29 AM   LDLCALC 53 08/27/2018 03:29 AM    Wt Readings from Last 3 Encounters:  10/03/18 231 lb 12.8 oz (105.1 kg)  08/27/18 232 lb 3.2 oz (105.3 kg)  02/23/18 234 lb (106.1 kg)     Objective:    Vital Signs:  Ht 5\' 11"  (1.803 m)   BMI 32.33 kg/m    VITAL SIGNS:  reviewed GEN:  no acute distress EYES:  sclerae anicteric, EOMI - Extraocular Movements Intact RESPIRATORY:  normal respiratory effort, symmetric expansion CARDIOVASCULAR:  no peripheral edema SKIN:  no rash, lesions or ulcers. MUSCULOSKELETAL:  no obvious deformities. NEURO:  alert and oriented x 3, no obvious focal deficit PSYCH:  normal affect  ASSESSMENT & PLAN:    1. PAF (paroxysmal atrial fibrillation) ([place])   2. Essential hypertension   3. Abnormal serum thyroid stimulating hormone (TSH) level   4. CKD (chronic kidney disease) stage 3, GFR 30-59 ml/min (HCC)   5. Type 2 diabetes mellitus with stage 3 chronic kidney disease, unspecified whether long term insulin use ([place])    He is overall doing well and denies recurrent episode of afib. He has maintained sinus rhythm, and has not had palpitations. We discussed the possibility of silent episodes of afib, and with a CHADS2VASC score of 3 he has  a 3% chance of stroke annually. He understands this, and would prefer not to be anticoagulated at this time since he feels his afib was very situational. We participated in shared decision making. He has discontinued metoprolol due to hypotension, which is reasonable.   HTN - well controlled on current therapy, no changes. Asymptomatic with BP 115/70.   Remainder per primary.    This patients CHA2DS2-VASc Score and unadjusted Ischemic Stroke Rate (% per year) is equal to 3.2 % stroke rate/year from a score of 3 Above score calculated as 1 point each if present [CHF, HTN, DM, Vascular=MI/PAD/Aortic Plaque, Age if 65-74, or Male] Above score calculated as 2 points each if present [Age > 75, or Stroke/TIA/TE]   COVID-19 Education: The signs and symptoms of COVID-19 were discussed with the patient and how to seek care for testing (follow up with PCP or arrange E-visit).  The importance of social distancing was discussed today.  Time:   Today, I have spent 11 minutes with the patient with telehealth technology discussing the above problems.     Medication  Adjustments/Labs and Tests Ordered: Current medicines are reviewed at length with the patient today.  Concerns regarding medicines are outlined above.   Tests Ordered: No orders of the defined types were placed in this encounter.   Medication Changes: No orders of the defined types were placed in this encounter.   Disposition:  Follow up in 6 month(s)  Signed, Elouise Munroe, MD  03/08/2019 12:37 PM    [facility]  Medication Instructions:  Your physician recommends that you continue on your current medications as directed. Please refer to the Current Medication list given to you today.  If you need a refill on your cardiac medications before your next appointment, please call your pharmacy.    Follow-Up: At [facility], you and your health needs are our priority.  As part of our continuing  mission to provide you with exceptional heart care, we have created designated Provider Care Teams.  These Care Teams include your primary Cardiologist (physician) and Advanced Practice Providers (APPs -  Physician Assistants and Nurse Practitioners) who all work together to provide you with the care you need, when you need it. You will need a follow up appointment in 6 months.  Please call our office 2 months in advance to schedule this appointment.  You may see one of the following Advanced Practice Providers on your designated Care Team:   Rosaria Ferries, PA-C . Jory Sims, DNP, ANP  Any Other Special Instructions Will Be Listed Below (If Applicable). None

## 2019-03-08 NOTE — Patient Instructions (Signed)
Medication Instructions:  Your physician recommends that you continue on your current medications as directed. Please refer to the Current Medication list given to you today.  If you need a refill on your cardiac medications before your next appointment, please call your pharmacy.    Follow-Up: At [facility], you and your health needs are our priority.  As part of our continuing mission to provide you with exceptional heart care, we have created designated Provider Care Teams.  These Care Teams include your primary Cardiologist (physician) and Advanced Practice Providers (APPs -  Physician Assistants and Nurse Practitioners) who all work together to provide you with the care you need, when you need it. You will need a follow up appointment in 6 months.  Please call our office 2 months in advance to schedule this appointment.  You may see one of the following Advanced Practice Providers on your designated Care Team:   Rosaria Ferries, PA-C . Jory Sims, DNP, ANP  Any Other Special Instructions Will Be Listed Below (If Applicable). None

## 2019-09-16 NOTE — Progress Notes (Signed)
Virtual Visit via Telephone Note   This visit type was conducted due to national recommendations for restrictions regarding the COVID-19 Pandemic (e.g. social distancing) in an effort to limit this patient's exposure and mitigate transmission in our community.  Due to his co-morbid illnesses, this patient is at least at moderate risk for complications without adequate follow up.  This format is felt to be most appropriate for this patient at this time.  The patient did not have access to video technology/had technical difficulties with video requiring transitioning to audio format only (telephone).  All issues noted in this document were discussed and addressed.  No physical exam could be performed with this format.  Please refer to the patient's chart for his  consent to telehealth for [facility].   Date:  09/17/2019   ID:  Phillip Williams, DOB May 02, 1951, MRN [id number]  Patient Location: Home Provider Location: Home  PCP:  Phillip Ebbs, MD  Cardiologist:  Phillip Munroe, MD  Electrophysiologist:  None   Evaluation Performed:  Follow-Up Visit  Chief Complaint:  Follow up BP  History of Present Illness:    Phillip Williams is a 68 y.o. male we are following for ongoing assessment and management of hypertension, hyperlipidemia, paroxysmal atrial fibrillation, with other history to include type 2 diabetes, chronic kidney disease stage III.  And history of prostate cancer.  He apparently had one episode of atrial fibrillation with self conversion after eating itter cocoa, he has stopped eating this type of cocoa and has had no recurrent symptoms.  When last seen in the office by Phillip Williams, he was no longer taking Xarelto, he had also stopped taking metoprolol.  He was without complaints.  It was discussed with the patient that he may be having silent episodes of atrial fibrillation with a CHADS VASC Score of 3 and a 3% chance of stroke annually.  With shared decision making, the  patient preferred not to be on anticoagulation as he feels that his A. fib was very situational.  He was to continue his antihypertensive medications, on losartan, amlodipine, and HCTZ.  [name] is doing very well.  He is medically compliant.  He denies any rapid heart rhythm or irregular heart rhythm.  Blood pressure is well controlled.  He has occasional migraine headaches.  He is being followed by urology and PCP periodically with labs.  He denies any complaints nor has any concerns about his health.  He is staying socially distant wearing a mask, washing his hands, and doing his best to stay out of crowded public places.  The patient does not have symptoms concerning for COVID-19 infection (fever, chills, cough, or new shortness of breath).    Past Medical History:  Diagnosis Date  . A-fib ([place])    Anticoagulated on Xarelto  . Diabetes mellitus without complication ([place])   . Hypertension   . Prostate cancer [facility])    Past Surgical History:  Procedure Laterality Date  . LYMPHADENECTOMY Bilateral 03/17/2017   Procedure: PELVIC LYMPHADENECTOMY;  Surgeon: Phillip Bring, MD;  Location: [facility];  Service: Urology;  Laterality: Bilateral;  . PROSTATE BIOPSY    . ROBOT ASSISTED LAPAROSCOPIC RADICAL PROSTATECTOMY N/A 03/17/2017   Procedure: XI ROBOTIC ASSISTED LAPAROSCOPIC RADICAL PROSTATECTOMY LEVEL 2;  Surgeon: Phillip Bring, MD;  Location: [facility];  Service: Urology;  Laterality: N/A;     Current Meds  Medication Sig  . Aspirin-Salicylamide-Caffeine (ARTHRITIS STRENGTH BC POWDER PO) Take 1 packet by mouth daily as needed (pain).  . Ibuprofen-diphenhydrAMINE Cit (  ADVIL PM PO) Take 1 tablet by mouth at bedtime as needed (sleep).  . metFORMIN (GLUCOPHAGE) 500 MG tablet Take 500 mg by mouth 2 (two) times daily with a meal.  . Multiple Vitamin (MULTIVITAMIN WITH MINERALS) TABS tablet Take 1 tablet by mouth daily. One a Day Men's  . Olmesartan-amLODIPine-HCTZ (TRIBENZOR) 40-10-25 MG TABS Take 1  tablet by mouth at bedtime.  . Olopatadine HCl (PAZEO) 0.7 % SOLN Place 1 drop into both eyes at bedtime.     Allergies:   Pork-derived products   Social History   Tobacco Use  . Smoking status: Never Smoker  . Smokeless tobacco: Never Used  Substance Use Topics  . Alcohol use: No  . Drug use: No     Family Hx: The patient's family history is not on file.  ROS:   Please see the history of present illness.   All other systems reviewed and are negative.   Prior CV studies:   The following studies were reviewed today: Echocardiogram 08/28/2019 Left ventricle: The cavity size was normal. There was mild   concentric hypertrophy. Systolic function was normal. The   estimated ejection fraction was in the range of 55% to 60%. Wall   motion was normal; there were no regional wall motion   abnormalities. - Aortic valve: There was trivial regurgitation. - Left atrium: The atrium was mildly dilated. - Right atrium: The atrium was mildly dilated.  Labs/Other Tests and Data Reviewed:    EKG:  No ECG reviewed.  Recent Labs: 12/18/2018: BUN 21; Creatinine, Ser 1.71; Hemoglobin 13.2; Platelets 290; Potassium 3.1; Sodium 134   Recent Lipid Panel Lab Results  Component Value Date/Time   CHOL 96 08/27/2018 03:29 AM   TRIG 51 08/27/2018 03:29 AM   HDL 33 (L) 08/27/2018 03:29 AM   CHOLHDL 2.9 08/27/2018 03:29 AM   LDLCALC 53 08/27/2018 03:29 AM    Wt Readings from Last 3 Encounters:  10/03/18 231 lb 12.8 oz (105.1 kg)  08/27/18 232 lb 3.2 oz (105.3 kg)  02/23/18 234 lb (106.1 kg)     Objective:    Vital Signs:  BP 119/80   Ht 5\' 9"  (1.753 m)   BMI 34.23 kg/m    VITAL SIGNS:  reviewed GEN:  no acute distress NEURO:  alert and oriented x 3, no obvious focal deficit PSYCH:  normal affect  ASSESSMENT & PLAN:    1. PAF: No further episodes. He is no longer on anticoagulation or rate control medications.   2. Hypertension: Excellent control on current regimen of  olmesartan, amlodipine, and HCTZ.  He is medically compliant. I have provided 90 day supply refills. Labs per PCP on September 24, 2019.   3. Diabetes: Followed by PCP. Continue metformin.   4. CKD: Followed by nephrology.   COVID-19 Education: The signs and symptoms of COVID-19 were discussed with the patient and how to seek care for testing (follow up with PCP or arrange E-visit). The importance of social distancing was discussed today.  Time:   Today, I have spent 11 minutes with the patient with telehealth technology discussing the above problems.     Medication Adjustments/Labs and Tests Ordered: Current medicines are reviewed at length with the patient today.  Concerns regarding medicines are outlined above.   Tests Ordered: No orders of the defined types were placed in this encounter.   Medication Changes: No orders of the defined types were placed in this encounter.   Disposition:  Follow up Dr. Margaretann Loveless May 2021  Signed, [name], ANP, AACC  09/17/2019 8:32 AM    [facility]

## 2019-09-17 ENCOUNTER — Encounter: Payer: Self-pay | Admitting: Adult Health

## 2019-09-17 ENCOUNTER — Telehealth (INDEPENDENT_AMBULATORY_CARE_PROVIDER_SITE_OTHER): Payer: Commercial Managed Care - PPO | Admitting: Adult Health

## 2019-09-17 VITALS — BP 119/80 | Ht 69.0 in

## 2019-09-17 DIAGNOSIS — N183 Chronic kidney disease, stage 3 unspecified: Secondary | ICD-10-CM

## 2019-09-17 DIAGNOSIS — I10 Essential (primary) hypertension: Secondary | ICD-10-CM

## 2019-09-17 DIAGNOSIS — I480 Paroxysmal atrial fibrillation: Secondary | ICD-10-CM

## 2019-09-17 MED ORDER — OLMESARTAN-AMLODIPINE-HCTZ 40-10-25 MG PO TABS: 1.0000 | ORAL_TABLET | Freq: Every day | ORAL | 3 refills | Status: DC

## 2019-09-17 NOTE — Patient Instructions (Signed)
Medication Instructions:  Continue current medications  *If you need a refill on your cardiac medications before your next appointment, please call your pharmacy*  Lab Work: None Ordered  Testing/Procedures: None Ordered  Follow-Up: At [facility], you and your health needs are our priority.  As part of our continuing mission to provide you with exceptional heart care, we have created designated Provider Care Teams.  These Care Teams include your primary Cardiologist (physician) and Advanced Practice Providers (APPs -  Physician Assistants and Nurse Practitioners) who all work together to provide you with the care you need, when you need it.  Your next appointment:   6 month(s)  The format for your next appointment:   In Person  Provider:   Cherlynn Kaiser, MD

## 2019-10-23 IMAGING — CR DG CHEST 2V
2 series · 2 of 2 positions shown · non-contrast
Comparison: March 07, 2017

CLINICAL DATA: Shortness of breath for 1 day

EXAM:
CHEST - 2 VIEW

[chest pa]
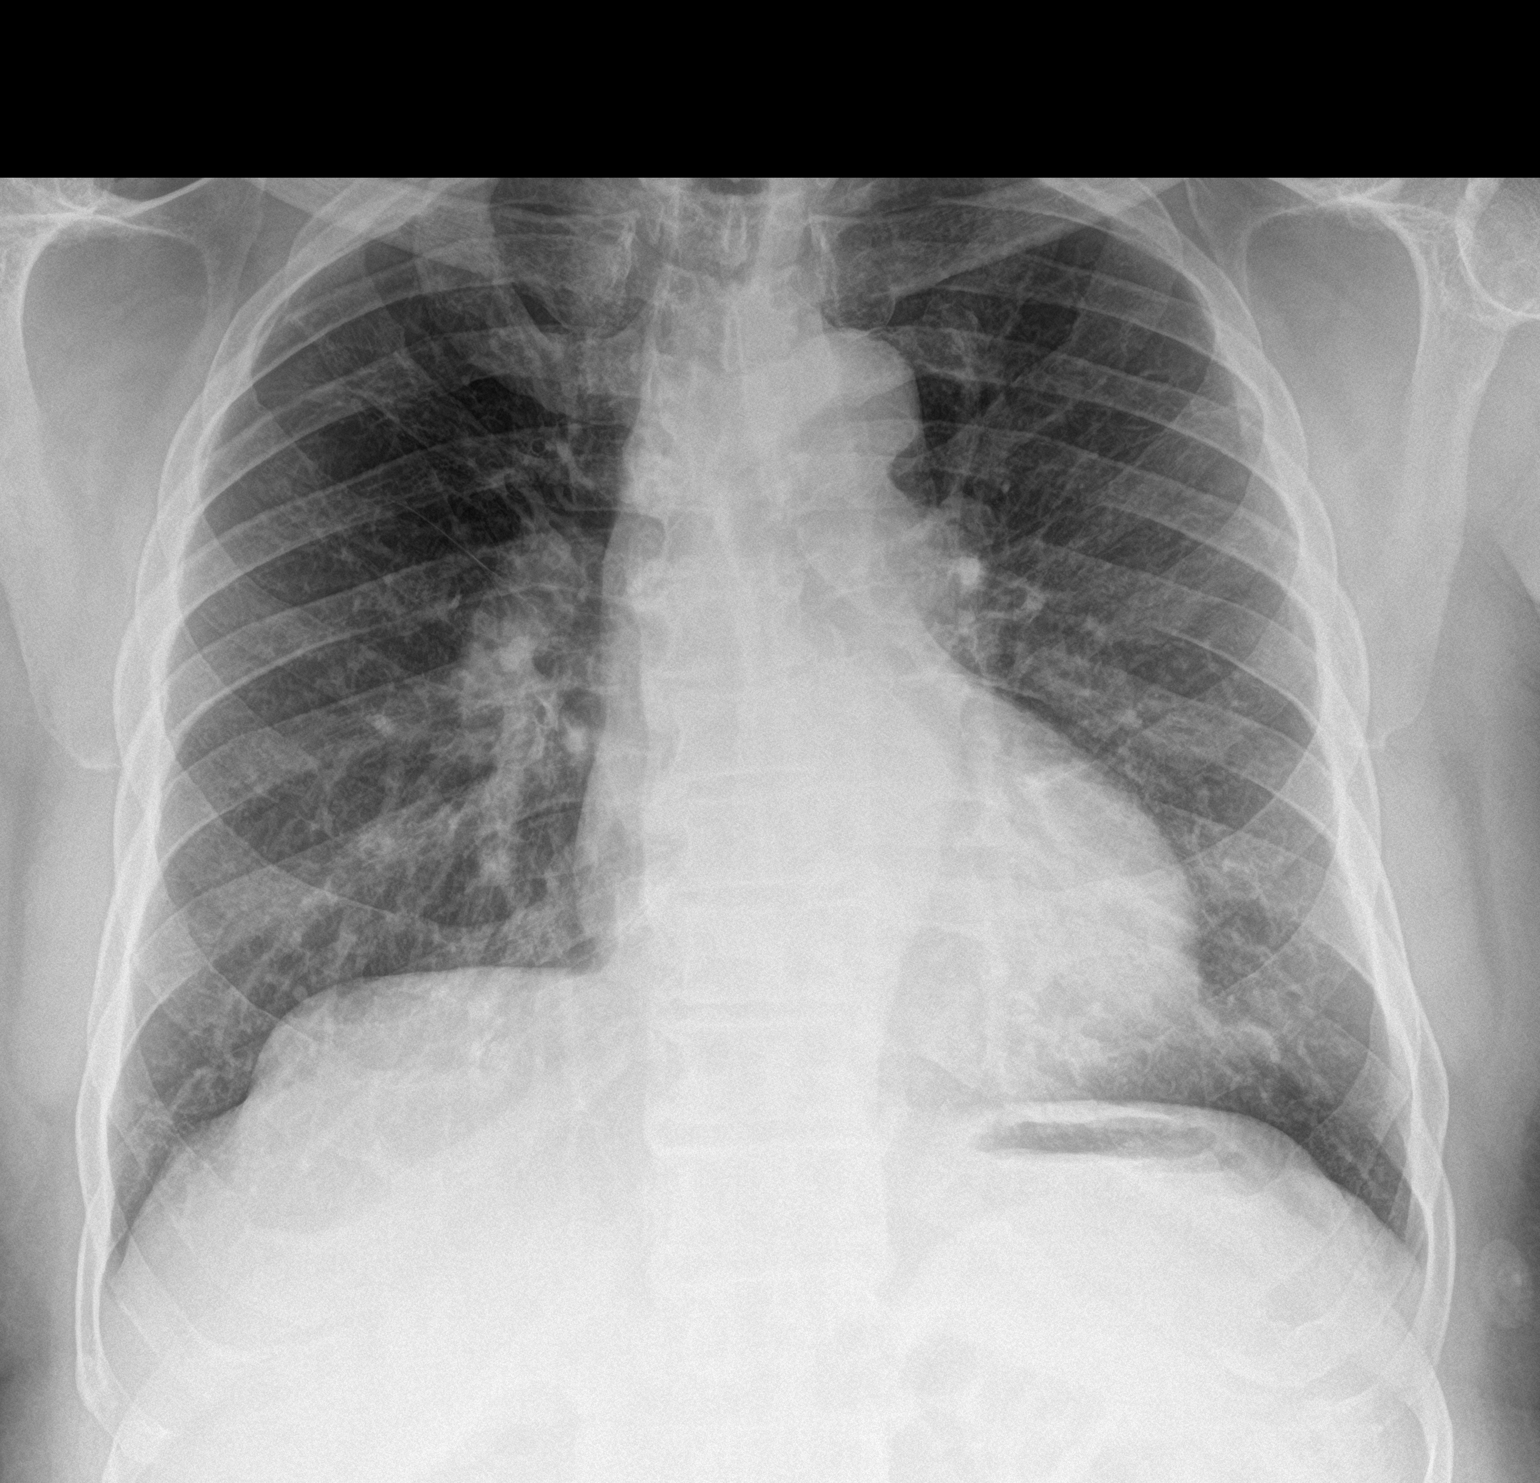

[chest lat]
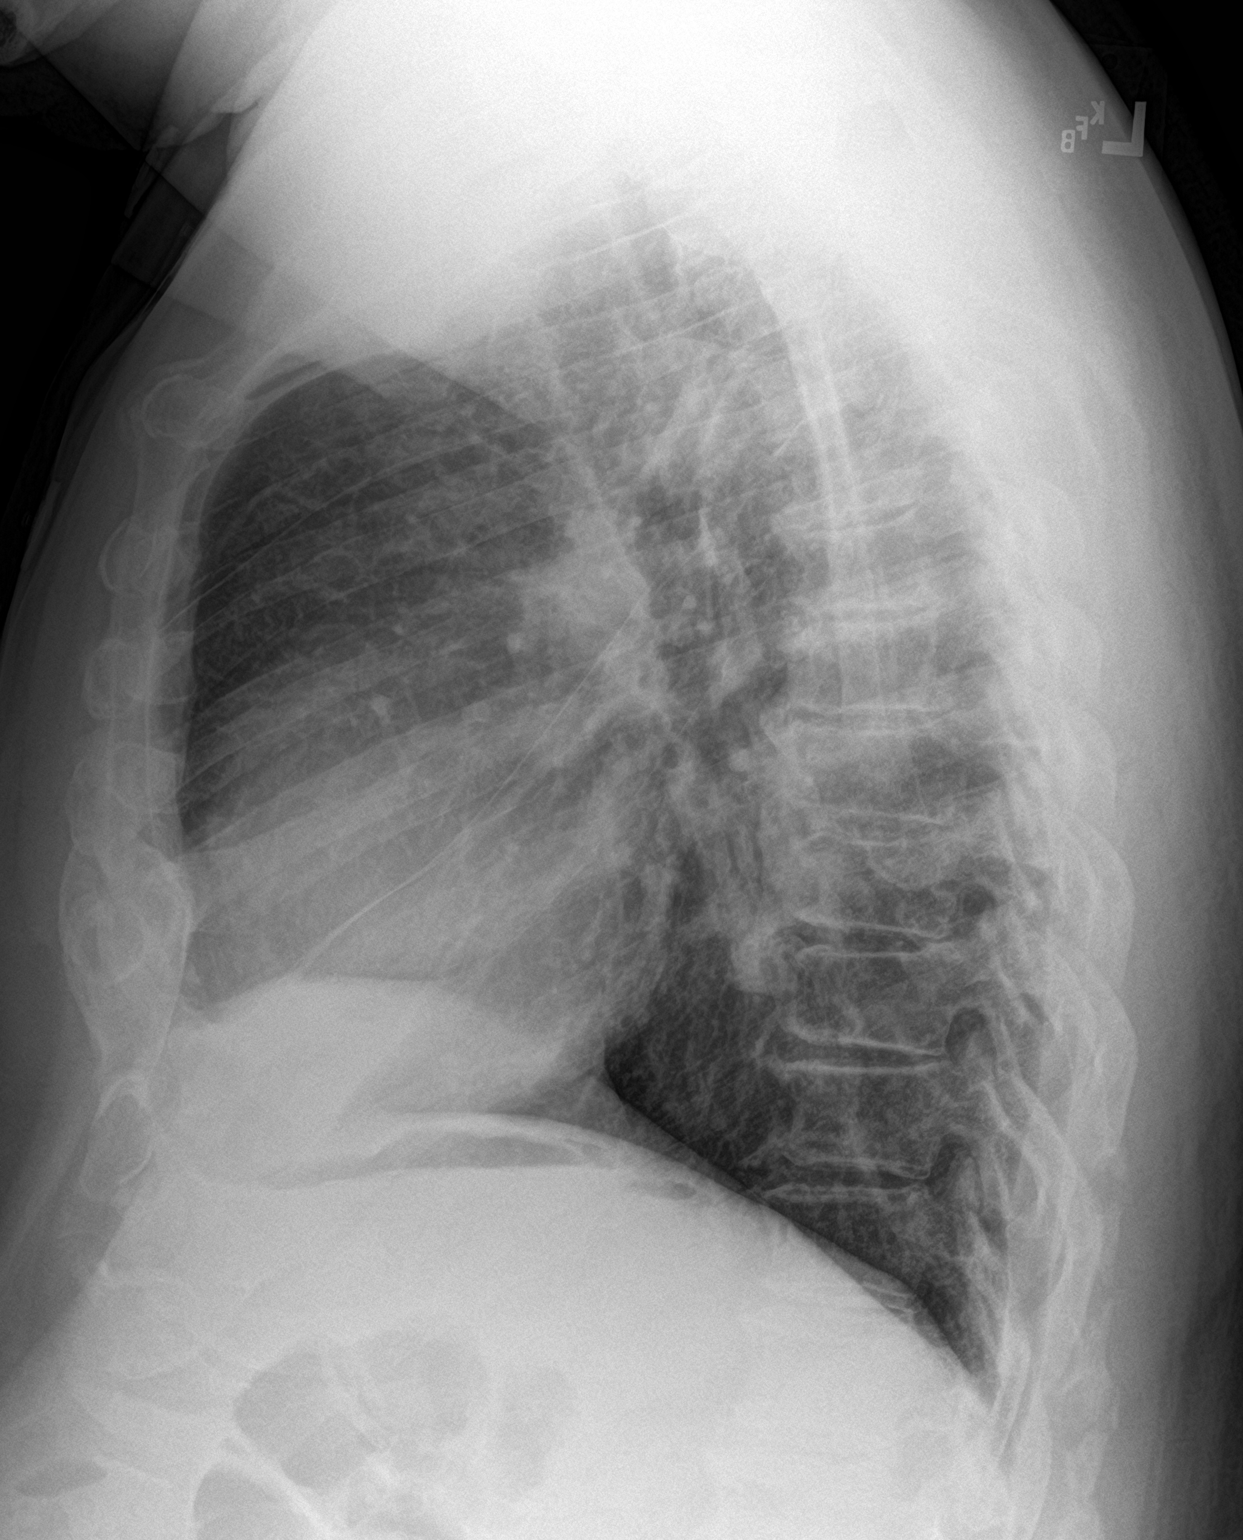

[2 of 2 positions shown; findings below may reference images not displayed]

FINDINGS: The heart, hila, and mediastinum are normal. No pulmonary nodules or
masses. No focal infiltrates or overt edema.
IMPRESSION: No active cardiopulmonary disease.

## 2019-12-04 ENCOUNTER — Other Ambulatory Visit: Payer: Self-pay | Admitting: Gastroenterology

## 2019-12-04 DIAGNOSIS — Z1211 Encounter for screening for malignant neoplasm of colon: Secondary | ICD-10-CM

## 2020-01-17 ENCOUNTER — Inpatient Hospital Stay: Admission: RE | Admit: 2020-01-17 | Payer: Commercial Managed Care - PPO | Source: Ambulatory Visit

## 2020-01-19 ENCOUNTER — Ambulatory Visit: Payer: Commercial Managed Care - PPO

## 2020-02-14 ENCOUNTER — Other Ambulatory Visit: Payer: Self-pay

## 2020-02-14 ENCOUNTER — Ambulatory Visit (INDEPENDENT_AMBULATORY_CARE_PROVIDER_SITE_OTHER): Payer: Commercial Managed Care - PPO | Admitting: Internal Medicine

## 2020-02-14 ENCOUNTER — Encounter: Payer: Self-pay | Admitting: Internal Medicine

## 2020-02-14 VITALS — BP 92/72 | HR 70 | Ht 71.0 in | Wt 239.4 lb

## 2020-02-14 DIAGNOSIS — I480 Paroxysmal atrial fibrillation: Secondary | ICD-10-CM | POA: Diagnosis not present

## 2020-02-14 DIAGNOSIS — N183 Chronic kidney disease, stage 3 unspecified: Secondary | ICD-10-CM | POA: Diagnosis not present

## 2020-02-14 DIAGNOSIS — E1122 Type 2 diabetes mellitus with diabetic chronic kidney disease: Secondary | ICD-10-CM | POA: Diagnosis not present

## 2020-02-14 DIAGNOSIS — I10 Essential (primary) hypertension: Secondary | ICD-10-CM

## 2020-02-14 MED ORDER — OLMESARTAN MEDOXOMIL-HCTZ 40-25 MG PO TABS: 1.0000 | ORAL_TABLET | Freq: Every day | ORAL | 4 refills | Status: DC

## 2020-02-14 NOTE — Progress Notes (Signed)
Cardiology Office Note:    Date:  02/14/2020   ID:  Phillip Williams, DOB November 25, 1950, MRN [id number]  PCP:  Nolene Ebbs, MD  Cardiologist:  Elouise Munroe, MD  Electrophysiologist:  None   Referring MD: Nolene Ebbs, MD   Chief Complaint: follow up hypertension  History of Present Illness:    Phillip Williams is a 69 y.o. male with a history of paroxysmal atrial fibrillation, hypertension, hyperlipidemia, diabetes mellitus type 2, chronic kidney disease stage III, prostate cancer.  I initially met the patient during an isolated episode of atrial fibrillation after he ate bitter cocoa and atrial fibrillation with rapid ventricular response.  Plan was for TEE cardioversion, however the patient self converted.  He briefly took [place] for anticoagulation but this was discontinued due to lack of recurrence of A. Fib.  He has been symptom free from the standpoint of atrial fibrillation.  He denies left-sided chest pain, shortness of breath, PND, orthopnea, leg swelling.  He feels that his blood pressure runs a bit low, and is wondering if there is anything to adjust on his medical therapy.  He is on a triple combination antihypertensive pill, and we discussed options for modifying this.  He also inquires about the block on his right side and points to his right ribs.  I have clarified that he has right bundle branch block on his EKG which is on the right side of his heart and not on the right side of his chest wall.  We discussed that this can be observed over time given that it appears to be new on today's ECG, but is likely not a worrisome finding in the context of his overall asymptomatic status.  Past Medical History:  Diagnosis Date  . A-fib ([place])   . Diabetes mellitus without complication ([place])   . Hypertension   . Prostate cancer [facility])     Past Surgical History:  Procedure Laterality Date  . LYMPHADENECTOMY Bilateral 03/17/2017   Procedure: PELVIC LYMPHADENECTOMY;  Surgeon:  Raynelle Bring, MD;  Location: [facility];  Service: Urology;  Laterality: Bilateral;  . PROSTATE BIOPSY    . ROBOT ASSISTED LAPAROSCOPIC RADICAL PROSTATECTOMY N/A 03/17/2017   Procedure: XI ROBOTIC ASSISTED LAPAROSCOPIC RADICAL PROSTATECTOMY LEVEL 2;  Surgeon: Raynelle Bring, MD;  Location: [facility];  Service: Urology;  Laterality: N/A;    Current Medications: Current Meds  Medication Sig  . Aspirin-Salicylamide-Caffeine (ARTHRITIS STRENGTH BC POWDER PO) Take 1 packet by mouth daily as needed (pain).  Marland Kitchen diclofenac Sodium (VOLTAREN) 1 % GEL SMARTSIG:4 Gram(s) Topical 4 Times Daily PRN  . ibuprofen (ADVIL) 800 MG tablet Take 800 mg by mouth 2 (two) times daily as needed.  . Ibuprofen-diphenhydrAMINE Cit (ADVIL PM PO) Take 1 tablet by mouth at bedtime as needed (sleep).  . metFORMIN (GLUCOPHAGE) 500 MG tablet Take 500 mg by mouth 2 (two) times daily with a meal.  . Multiple Vitamin (MULTIVITAMIN WITH MINERALS) TABS tablet Take 1 tablet by mouth daily. One a Day Men's  . Olopatadine HCl (PAZEO) 0.7 % SOLN Place 1 drop into both eyes at bedtime.  . potassium chloride (KLOR-CON) 10 MEQ tablet Take 10 mEq by mouth daily.  . [DISCONTINUED] Olmesartan-amLODIPine-HCTZ (TRIBENZOR) 40-10-25 MG TABS Take 1 tablet by mouth at bedtime.     Allergies:   Pork-derived products   Social History   Socioeconomic History  . Marital status: Married    Spouse name: Not on file  . Number of children: Not on file  . Years of education:  Not on file  . Highest education level: Not on file  Occupational History  . Not on file  Tobacco Use  . Smoking status: Never Smoker  . Smokeless tobacco: Never Used  Substance and Sexual Activity  . Alcohol use: No  . Drug use: No  . Sexual activity: Not on file  Other Topics Concern  . Not on file  Social History Narrative  . Not on file   Social Determinants of Health   Financial Resource Strain:   . Difficulty of Paying Living Expenses:   Food Insecurity:   .  Worried About Charity fundraiser in the Last Year:   . Arboriculturist in the Last Year:   Transportation Needs:   . Film/video editor (Medical):   Marland Kitchen Lack of Transportation (Non-Medical):   Physical Activity:   . Days of Exercise per Week:   . Minutes of Exercise per Session:   Stress:   . Feeling of Stress :   Social Connections:   . Frequency of Communication with Friends and Family:   . Frequency of Social Gatherings with Friends and Family:   . Attends Religious Services:   . Active Member of Clubs or Organizations:   . Attends Archivist Meetings:   Marland Kitchen Marital Status:      Family History: The patient's family history is not on file.  ROS:   Please see the history of present illness.    All other systems reviewed and are negative.  EKGs/Labs/Other Studies Reviewed:    The following studies were reviewed today:  EKG:  NSR, RBBB, QRS 134 ms, rate 70  Recent Labs: No results found for requested labs within last 8760 hours.  Recent Lipid Panel    Component Value Date/Time   CHOL 96 08/27/2018 0329   TRIG 51 08/27/2018 0329   HDL 33 (L) 08/27/2018 0329   CHOLHDL 2.9 08/27/2018 0329   VLDL 10 08/27/2018 0329   LDLCALC 53 08/27/2018 0329    Physical Exam:    VS:  BP 92/72   Pulse 70   Ht _0  (1.803 m)   Wt 239 lb 6.4 oz (108.6 kg)   SpO2 98%   BMI 33.39 kg/m     Wt Readings from Last 5 Encounters:  02/14/20 239 lb 6.4 oz (108.6 kg)  10/03/18 231 lb 12.8 oz (105.1 kg)  08/27/18 232 lb 3.2 oz (105.3 kg)  02/23/18 234 lb (106.1 kg)  08/05/17 227 lb 9.6 oz (103.2 kg)     Constitutional: No acute distress Eyes: sclera non-icteric, normal conjunctiva and lids ENMT: Mask in place Cardiovascular: regular rhythm, normal rate, no murmurs. S1 and S2 normal. Radial pulses normal bilaterally. No jugular venous distention.  Respiratory: clear to auscultation bilaterally GI : normal bowel sounds, soft and nontender. No distention.   MSK:  extremities warm, well perfused. No edema.  NEURO: grossly nonfocal exam, moves all extremities. PSYCH: alert and oriented x 3, normal mood and affect.   ASSESSMENT:    1. PAF (paroxysmal atrial fibrillation) ([place])   2. Essential hypertension   3. Type 2 diabetes mellitus with stage 3 chronic kidney disease, without long-term current use of insulin, unspecified whether stage 3a or 3b CKD (HCC)   4. Stage 3 chronic kidney disease, unspecified whether stage 3a or 3b CKD    PLAN:    PAF (paroxysmal atrial fibrillation) ([place]) He had one episode of atrial fibrillation that has not recurred since stopping the consumption of  a bitter cocoa product.  He is no longer on anticoagulation and takes nothing for rate control.  We will continue to observe.  CHA2DS2-VASc score would be at least 2 for age and hypertension if he has a recurrence of atrial fibrillation, consideration for resumption of anticoagulation should be performed.  This has been reviewed with the patient over prior office visits.  Essential hypertension - Plan: olmesartan-hydrochlorothiazide (BENICAR HCT) 40-25 MG tablet  He is experiencing some hypotension which is mildly symptomatic, we will remove the amlodipine component of his triple therapy antihypertensive pill and monitor for response.  Type 2 diabetes mellitus with stage 3 chronic kidney disease, without long-term current use of insulin, unspecified whether stage 3a or 3b CKD ([place]) Per PCP  Total time of encounter: 30 minutes total time of encounter, including 20 minutes spent in face-to-face patient care on the date of this encounter. This time includes coordination of care and counseling regarding above mentioned problem list. Remainder of non-face-to-face time involved reviewing chart documents/testing relevant to the patient encounter and documentation in the medical record. I have independently reviewed documentation from referring provider.   Cherlynn Kaiser, MD [facility]  [facility]    Medication Adjustments/Labs and Tests Ordered: Current medicines are reviewed at length with the patient today.  Concerns regarding medicines are outlined above.  No orders of the defined types were placed in this encounter.  Meds ordered this encounter  Medications  . olmesartan-hydrochlorothiazide (BENICAR HCT) 40-25 MG tablet    Sig: Take 1 tablet by mouth daily. PLEASE NOTE NO AMLODIPINE    Dispense:  30 tablet    Refill:  4    Patient Instructions  Medication Instructions:  START OLMESARTAN-HCTZ 40-25MG DAILY *If you need a refill on your cardiac medications before your next appointment, please call your pharmacy*  Follow-Up: Your next appointment:  3 month(s) Please call our office 2 months in advance to schedule this appointment In Person with Cherlynn Kaiser, MD  At [facility], you and your health needs are our priority.  As part of our continuing mission to provide you with exceptional heart care, we have created designated Provider Care Teams.  These Care Teams include your primary Cardiologist (physician) and Advanced Practice Providers (APPs -  Physician Assistants and Nurse Practitioners) who all work together to provide you with the care you need, when you need it.  We recommend signing up for the patient portal called "MyChart".  Sign up information is provided on this After Visit Summary.  MyChart is used to connect with patients for Virtual Visits (Telemedicine).  Patients are able to view lab/test results, encounter notes, upcoming appointments, etc.  Non-urgent messages can be sent to your provider as well.   To learn more about what you can do with MyChart, go to [email or web address].

## 2020-02-14 NOTE — Patient Instructions (Signed)
Medication Instructions:  START OLMESARTAN-HCTZ 40-25MG  DAILY *If you need a refill on your cardiac medications before your next appointment, please call your pharmacy*  Follow-Up: Your next appointment:  3 month(s) Please call our office 2 months in advance to schedule this appointment In Person with Cherlynn Kaiser, MD  At [facility], you and your health needs are our priority.  As part of our continuing mission to provide you with exceptional heart care, we have created designated Provider Care Teams.  These Care Teams include your primary Cardiologist (physician) and Advanced Practice Providers (APPs -  Physician Assistants and Nurse Practitioners) who all work together to provide you with the care you need, when you need it.  We recommend signing up for the patient portal called "MyChart".  Sign up information is provided on this After Visit Summary.  MyChart is used to connect with patients for Virtual Visits (Telemedicine).  Patients are able to view lab/test results, encounter notes, upcoming appointments, etc.  Non-urgent messages can be sent to your provider as well.   To learn more about what you can do with MyChart, go to [email or web address].

## 2020-03-01 ENCOUNTER — Encounter: Payer: Self-pay | Admitting: Internal Medicine

## 2020-05-20 ENCOUNTER — Ambulatory Visit: Payer: Commercial Managed Care - PPO | Admitting: Internal Medicine

## 2020-05-20 ENCOUNTER — Telehealth: Payer: Self-pay | Admitting: *Deleted

## 2020-05-20 NOTE — Telephone Encounter (Signed)
A detailed message was left, re: reschedule his appointment out three months.

## 2020-08-18 ENCOUNTER — Ambulatory Visit: Payer: Commercial Managed Care - PPO | Admitting: Internal Medicine

## 2020-09-01 ENCOUNTER — Other Ambulatory Visit: Payer: Self-pay

## 2020-09-01 ENCOUNTER — Ambulatory Visit (INDEPENDENT_AMBULATORY_CARE_PROVIDER_SITE_OTHER): Payer: Commercial Managed Care - PPO | Admitting: Internal Medicine

## 2020-09-01 ENCOUNTER — Encounter: Payer: Self-pay | Admitting: Internal Medicine

## 2020-09-01 VITALS — BP 92/62 | HR 76 | Ht 71.0 in | Wt 231.0 lb

## 2020-09-01 DIAGNOSIS — I10 Essential (primary) hypertension: Secondary | ICD-10-CM | POA: Diagnosis not present

## 2020-09-01 DIAGNOSIS — I480 Paroxysmal atrial fibrillation: Secondary | ICD-10-CM

## 2020-09-01 NOTE — Progress Notes (Signed)
Cardiology Office Note:    Date:  09/01/2020   ID:  Phillip Williams, DOB 25-Feb-1951, MRN [id number]  PCP:  Nolene Ebbs, MD  Cardiologist:  Elouise Munroe, MD  Electrophysiologist:  None   Referring MD: Nolene Ebbs, MD   Chief Complaint/Reason for Referral: Palpitations  History of Present Illness:    Phillip Williams is a 69 y.o. male with a history of paroxysmal atrial fibrillation, hypertension, hyperlipidemia, diabetes mellitus type 2, chronic kidney disease stage III, prostate cancer.  I initially met the patient during an isolated episode of atrial fibrillation after he ate bitter cocoa and atrial fibrillation with rapid ventricular response.  Plan was for TEE cardioversion, however the patient self converted.  He briefly took [place] for anticoagulation but this was discontinued due to lack of recurrence of A. Fib.   Cramps in both legs when sleeping. Not while walking or while sitting. Drinks fluid, uses topical balm. Tried postassium, helps some.   Went back to olmesartan-hctz-amlodipine because he had severe HTN when off of amlodipine component. BP is low but he is asymptomatic.  Palpitations 2-3x month. 1-2 minutes. We discussed 30 day monitor with history of AFIB and chads2vasc score of 3 (age, HTN, DM).  Past Medical History:  Diagnosis Date  . A-fib ([place])   . Diabetes mellitus without complication ([place])   . Hypertension   . Prostate cancer [facility])     Past Surgical History:  Procedure Laterality Date  . LYMPHADENECTOMY Bilateral 03/17/2017   Procedure: PELVIC LYMPHADENECTOMY;  Surgeon: Raynelle Bring, MD;  Location[facility];  Service: Urology;  Laterality: Bilateral;  . PROSTATE BIOPSY    . ROBOT ASSISTED LAPAROSCOPIC RADICAL PROSTATECTOMY N/A 03/17/2017   Procedure: XI ROBOTIC ASSISTED LAPAROSCOPIC RADICAL PROSTATECTOMY LEVEL 2;  Surgeon: Raynelle Bring, MD;  Location[facility];  Service: Urology;  Laterality: N/A;    Current Medications: Current Meds    Medication Sig  . Aspirin-Salicylamide-Caffeine (ARTHRITIS STRENGTH BC POWDER PO) Take 1 packet by mouth daily as needed (pain).  . Ibuprofen-diphenhydrAMINE Cit (ADVIL PM PO) Take 1 tablet by mouth at bedtime as needed (sleep).  . metFORMIN (GLUCOPHAGE) 500 MG tablet Take 500 mg by mouth 2 (two) times daily with a meal.  . Multiple Vitamin (MULTIVITAMIN WITH MINERALS) TABS tablet Take 1 tablet by mouth daily. One a Day Men's  . Olmesartan-amLODIPine-HCTZ (TRIBENZOR) 40-5-25 MG TABS Take by mouth daily.  . Olopatadine HCl (PAZEO) 0.7 % SOLN Place 1 drop into both eyes at bedtime.  . potassium chloride (KLOR-CON) 10 MEQ tablet Take 10 mEq by mouth daily.  Marland Kitchen tiZANidine (ZANAFLEX) 4 MG tablet Take 4 mg by mouth at bedtime as needed.  . [DISCONTINUED] diclofenac Sodium (VOLTAREN) 1 % GEL SMARTSIG:4 Gram(s) Topical 4 Times Daily PRN     Allergies:   Pork-derived products   Social History   Tobacco Use  . Smoking status: Never Smoker  . Smokeless tobacco: Never Used  Substance Use Topics  . Alcohol use: No  . Drug use: No     Family History: The patient's family history is not on file.  ROS:   Please see the history of present illness.    All other systems reviewed and are negative.  EKGs/Labs/Other Studies Reviewed:    The following studies were reviewed today:  EKG:  SR, RBBB  Recent Labs: No results found for requested labs within last 8760 hours.  Recent Lipid Panel    Component Value Date/Time   CHOL 96 08/27/2018 0329   TRIG  51 08/27/2018 0329   HDL 33 (L) 08/27/2018 0329   CHOLHDL 2.9 08/27/2018 0329   VLDL 10 08/27/2018 0329   LDLCALC 53 08/27/2018 0329    Physical Exam:    VS:  BP 92/62 (BP Location: Left Arm, Patient Position: Sitting)   Pulse 76   Ht 5' 11"  (1.803 m)   Wt 231 lb (104.8 kg)   SpO2 98%   BMI 32.22 kg/m     Wt Readings from Last 5 Encounters:  09/01/20 231 lb (104.8 kg)  02/14/20 239 lb 6.4 oz (108.6 kg)  10/03/18 231 lb 12.8 oz  (105.1 kg)  08/27/18 232 lb 3.2 oz (105.3 kg)  02/23/18 234 lb (106.1 kg)    Constitutional: No acute distress Eyes: sclera non-icteric, normal conjunctiva and lids ENMT: normal dentition, moist mucous membranes Cardiovascular: regular rhythm, normal rate, no murmurs. S1 and S2 normal. Radial pulses normal bilaterally. No jugular venous distention.  Respiratory: clear to auscultation bilaterally GI : normal bowel sounds, soft and nontender. No distention.   MSK: extremities warm, well perfused. No edema.  NEURO: grossly nonfocal exam, moves all extremities. PSYCH: alert and oriented x 3, normal mood and affect.   ASSESSMENT:    1. PAF (paroxysmal atrial fibrillation) ([place])   2. Essential hypertension    PLAN:    PAF (paroxysmal atrial fibrillation) ([place]) - Plan: EKG 12-Lead, CARDIAC EVENT MONITOR -he is having palpitations 2-3 x a month, and it would be good to screen for afib given prior history and need for anticoagulation if detected. Will perform 30 day event monitor.   Essential hypertension  - he had hypertensive episodes with headaches off of Tribenzor, and has started this medication again. BP is low but he is asymptomatic and doing well otherwise. Can continue.    Total time of encounter: 30 minutes total time of encounter, including 20 minutes spent in face-to-face patient care on the date of this encounter. This time includes coordination of care and counseling regarding above mentioned problem list. Remainder of non-face-to-face time involved reviewing chart documents/testing relevant to the patient encounter and documentation in the medical record. I have independently reviewed documentation from referring provider.   Cherlynn Kaiser, MD [place]  [facility]    Medication Adjustments/Labs and Tests Ordered: Current medicines are reviewed at length with the patient today.  Concerns regarding medicines are outlined above.   Orders Placed This Encounter    Procedures  . CARDIAC EVENT MONITOR  . EKG 12-Lead    No orders of the defined types were placed in this encounter.   Patient Instructions  Medication Instructions:  YOU CAN TRY MAGNESIUM OXIDE 48m (1 tablet) daily for Leg Cramps  *If you need a refill on your cardiac medications before your next appointment, please call your pharmacy*  Lab Work: None Ordered At This Time.  If you have labs (blood work) drawn today and your tests are completely normal, you will receive your results only by: .Marland KitchenMyChart Message (if you have MyChart) OR . A paper copy in the mail If you have any lab test that is abnormal or we need to change your treatment, we will call you to review the results.  Testing/Procedures: Your physician has recommended that you wear an event monitor for 30 days. Event monitors are medical devices that record the heart's electrical activity. Doctors most often uKoreathese monitors to diagnose arrhythmias. Arrhythmias are problems with the speed or rhythm of the heartbeat. The monitor is a small, portable device.  You can wear one while you do your normal daily activities. This is usually used to diagnose what is causing palpitations/syncope (passing out).  Follow-Up: At [facility], you and your health needs are our priority.  As part of our continuing mission to provide you with exceptional heart care, we have created designated Provider Care Teams.  These Care Teams include your primary Cardiologist (physician) and Advanced Practice Providers (APPs -  Physician Assistants and Nurse Practitioners) who all work together to provide you with the care you need, when you need it.  We recommend signing up for the patient portal called "MyChart".  Sign up information is provided on this After Visit Summary.  MyChart is used to connect with patients for Virtual Visits (Telemedicine).  Patients are able to view lab/test results, encounter notes, upcoming appointments, etc.  Non-urgent  messages can be sent to your provider as well.   To learn more about what you can do with MyChart, go to [email or web address].    Your next appointment:   6 week(s)  The format for your next appointment:   In Person  Provider:   Cherlynn Kaiser, MD  Other Instructions  [employer] Cardiac Event Monitor Instructions Your physician has requested you wear your cardiac event monitor for 30 days, (1-30). [employer] may call or text to confirm a shipping address. The monitor will be sent to a land address via UPS. [employer] will not ship a monitor to a PO BOX. It typically takes 3-5 days to receive your monitor after it has been enrolled. [employer] will assist with USPS tracking if your package is delayed. The telephone number for [employer] is [phone]. Once you have received your monitor, please review the enclosed instructions. Instruction tutorials can also be viewed under help and settings on the enclosed cell phone. Your monitor has already been registered assigning a specific monitor serial # to you.  Applying the monitor Remove cell phone from case and turn it on. The cell phone works as Dealer and needs to be within [employer] of you at all times. The cell phone will need to be charged on a daily basis. We recommend you plug the cell phone into the enclosed charger at your bedside table every night.  Monitor batteries: You will receive two monitor batteries labelled #1 and #2. These are your recorders. Plug battery #2 onto the second connection on the enclosed charger. Keep one battery on the charger at all times. This will keep the monitor battery deactivated. It will also keep it fully charged for when you need to switch your monitor batteries. A small light will be blinking on the battery emblem when it is charging. The light on the battery emblem will remain on when the battery is fully charged.  Open package of a Monitor strip. Insert battery #1 into black  hood on strip and gently squeeze monitor battery onto connection as indicated in instruction booklet. Set aside while preparing skin.  Choose location for your strip, vertical or horizontal, as indicated in the instruction booklet. Shave to remove all hair from location. There cannot be any lotions, oils, powders, or colognes on skin where monitor is to be applied. Wipe skin clean with enclosed Saline wipe. Dry skin completely.  Peel paper labeled #1 off the back of the Monitor strip exposing the adhesive. Place the monitor on the chest in the vertical or horizontal position shown in the instruction booklet. One arrow on the monitor strip must be pointing upward. Carefully remove paper labeled #  2, attaching remainder of strip to your skin. Try not to create any folds or wrinkles in the strip as you apply it.  Firmly press and release the circle in the center of the monitor battery. You will hear a small beep. This is turning the monitor battery on. The heart emblem on the monitor battery will light up every 5 seconds if the monitor battery in turned on and connected to the patient securely. Do not push and hold the circle down as this turns the monitor battery off. The cell phone will locate the monitor battery. A screen will appear on the cell phone checking the connection of your monitor strip. This may read poor connection initially but change to good connection within the next minute. Once your monitor accepts the connection you will hear a series of 3 beeps followed by a climbing crescendo of beeps. A screen will appear on the cell phone showing the two monitor strip placement options. Touch the picture that demonstrates where you applied the monitor strip.  Your monitor strip and battery are waterproof. You are able to shower, bathe, or swim with the monitor on. They just ask you do not submerge deeper than 3 feet underwater. We recommend removing the monitor if you are swimming in a  lake, river, or ocean.  Your monitor battery will need to be switched to a fully charged monitor battery approximately once a week. The cell phone will alert you of an action which needs to be made.  On the cell phone, tap for details to reveal connection status, monitor battery status, and cell phone battery status. The green dots indicates your monitor is in good status. A red dot indicates there is something that needs your attention.  To record a symptom, click the circle on the monitor battery. In 30-60 seconds a list of symptoms will appear on the cell phone. Select your symptom and tap save. Your monitor will record a sustained or significant arrhythmia regardless of you clicking the button. Some patients do not feel the heart rhythm irregularities. [employer] will notify us of any serious or critical events.  Refer to instruction booklet for instructions on switching batteries, changing strips, the Do not disturb or Pause features, or any additional questions.  Call [employer] at [phone], to confirm your monitor is transmitting and record your baseline. They will answer any questions you may have regarding the monitor instructions at that time.  Returning the monitor to [place] all equipment back into blue box. Peel off strip of paper to expose adhesive and close box securely. There is a prepaid UPS shipping label on this box. Drop in a UPS drop box, or at a UPS facility like [employer]. You may also contact [employer] to arrange UPS to pick up monitor package at your home.

## 2020-09-01 NOTE — Patient Instructions (Signed)
Medication Instructions:  YOU CAN TRY MAGNESIUM OXIDE 400mg  (1 tablet) daily for Leg Cramps  *If you need a refill on your cardiac medications before your next appointment, please call your pharmacy*  Lab Work: None Ordered At This Time.  If you have labs (blood work) drawn today and your tests are completely normal, you will receive your results only by: Marland Kitchen MyChart Message (if you have MyChart) OR . A paper copy in the mail If you have any lab test that is abnormal or we need to change your treatment, we will call you to review the results.  Testing/Procedures: Your physician has recommended that you wear an event monitor for 30 days. Event monitors are medical devices that record the heart's electrical activity. Doctors most often [place] these monitors to diagnose arrhythmias. Arrhythmias are problems with the speed or rhythm of the heartbeat. The monitor is a small, portable device. You can wear one while you do your normal daily activities. This is usually used to diagnose what is causing palpitations/syncope (passing out).  Follow-Up: At [facility], you and your health needs are our priority.  As part of our continuing mission to provide you with exceptional heart care, we have created designated Provider Care Teams.  These Care Teams include your primary Cardiologist (physician) and Advanced Practice Providers (APPs -  Physician Assistants and Nurse Practitioners) who all work together to provide you with the care you need, when you need it.  We recommend signing up for the patient portal called "MyChart".  Sign up information is provided on this After Visit Summary.  MyChart is used to connect with patients for Virtual Visits (Telemedicine).  Patients are able to view lab/test results, encounter notes, upcoming appointments, etc.  Non-urgent messages can be sent to your provider as well.   To learn more about what you can do with MyChart, go to [email or web address].    Your next  appointment:   6 week(s)  The format for your next appointment:   In Person  Provider:   Cherlynn Kaiser, MD  Other Instructions  [employer] Cardiac Event Monitor Instructions Your physician has requested you wear your cardiac event monitor for 30 days, (1-30). [employer] may call or text to confirm a shipping address. The monitor will be sent to a land address via UPS. [employer] will not ship a monitor to a PO BOX. It typically takes 3-5 days to receive your monitor after it has been enrolled. [employer] will assist with USPS tracking if your package is delayed. The telephone number for [employer] is [phone]. Once you have received your monitor, please review the enclosed instructions. Instruction tutorials can also be viewed under help and settings on the enclosed cell phone. Your monitor has already been registered assigning a specific monitor serial # to you.  Applying the monitor Remove cell phone from case and turn it on. The cell phone works as Dealer and needs to be within [employer] of you at all times. The cell phone will need to be charged on a daily basis. We recommend you plug the cell phone into the enclosed charger at your bedside table every night.  Monitor batteries: You will receive two monitor batteries labelled #1 and #2. These are your recorders. Plug battery #2 onto the second connection on the enclosed charger. Keep one battery on the charger at all times. This will keep the monitor battery deactivated. It will also keep it fully charged for when you need to switch your monitor batteries. A small  light will be blinking on the battery emblem when it is charging. The light on the battery emblem will remain on when the battery is fully charged.  Open package of a Monitor strip. Insert battery #1 into black hood on strip and gently squeeze monitor battery onto connection as indicated in instruction booklet. Set aside while preparing skin.  Choose  location for your strip, vertical or horizontal, as indicated in the instruction booklet. Shave to remove all hair from location. There cannot be any lotions, oils, powders, or colognes on skin where monitor is to be applied. Wipe skin clean with enclosed Saline wipe. Dry skin completely.  Peel paper labeled #1 off the back of the Monitor strip exposing the adhesive. Place the monitor on the chest in the vertical or horizontal position shown in the instruction booklet. One arrow on the monitor strip must be pointing upward. Carefully remove paper labeled #2, attaching remainder of strip to your skin. Try not to create any folds or wrinkles in the strip as you apply it.  Firmly press and release the circle in the center of the monitor battery. You will hear a small beep. This is turning the monitor battery on. The heart emblem on the monitor battery will light up every 5 seconds if the monitor battery in turned on and connected to the patient securely. Do not push and hold the circle down as this turns the monitor battery off. The cell phone will locate the monitor battery. A screen will appear on the cell phone checking the connection of your monitor strip. This may read poor connection initially but change to good connection within the next minute. Once your monitor accepts the connection you will hear a series of 3 beeps followed by a climbing crescendo of beeps. A screen will appear on the cell phone showing the two monitor strip placement options. Touch the picture that demonstrates where you applied the monitor strip.  Your monitor strip and battery are waterproof. You are able to shower, bathe, or swim with the monitor on. They just ask you do not submerge deeper than 3 feet underwater. We recommend removing the monitor if you are swimming in a lake, river, or ocean.  Your monitor battery will need to be switched to a fully charged monitor battery approximately once a week. The cell  phone will alert you of an action which needs to be made.  On the cell phone, tap for details to reveal connection status, monitor battery status, and cell phone battery status. The green dots indicates your monitor is in good status. A red dot indicates there is something that needs your attention.  To record a symptom, click the circle on the monitor battery. In 30-60 seconds a list of symptoms will appear on the cell phone. Select your symptom and tap save. Your monitor will record a sustained or significant arrhythmia regardless of you clicking the button. Some patients do not feel the heart rhythm irregularities. [employer] will notify us of any serious or critical events.  Refer to instruction booklet for instructions on switching batteries, changing strips, the Do not disturb or Pause features, or any additional questions.  Call [employer] at [phone], to confirm your monitor is transmitting and record your baseline. They will answer any questions you may have regarding the monitor instructions at that time.  Returning the monitor to [place] all equipment back into blue box. Peel off strip of paper to expose adhesive and close box securely. There is a prepaid  UPS shipping label on this box. Drop in a UPS drop box, or at a UPS facility like [employer]. You may also contact [employer] to arrange UPS to pick up monitor package at your home.

## 2020-09-02 ENCOUNTER — Encounter: Payer: Self-pay | Admitting: *Deleted

## 2020-09-02 NOTE — Progress Notes (Signed)
Patient ID: Phillip Williams, male   DOB: January 12, 1951, 69 y.o.   MRN: [id number] Patient enrolled for [employer] to ship a 30 day cardiac event monitor to his home.

## 2020-10-28 ENCOUNTER — Ambulatory Visit: Payer: Commercial Managed Care - PPO | Admitting: Internal Medicine

## 2021-09-03 NOTE — Progress Notes (Signed)
Cardiology Office Note   Date:  09/04/2021   ID:  Phillip Williams, DOB [birth year], MRN [id number]  PCP:  Nolene Ebbs, MD  Cardiologist:   Elouise Munroe, MD  Electrophysiologist:  None  No chief complaint on file.    History of Present Illness: Phillip Williams is a 70 y.o. male who presents for paroxysmal atrial fibrillation, hyperlipidemia, hypertension, with other history to include type 2 diabetes, chronic kidney disease stage III, and prostate cancer.  The patient was last seen in the office on 02/14/2020 by Dr.Acharya.    At that time the patient had been symptom-free from the standpoint of atrial fibrillation, he was eating a highly caffeinated food supplement, bitter cocoa, and has since stopped this for over a year.  denied any chest pain shortness of breath PND orthopnea or lower extremity edema.  He was also educated on his right bundle branch block, that it was not a worrisome finding as he was very asymptomatic and this should be followed over time.  He was no longer on anticoagulation and was not on any rate control medications.  If he had recurrence of atrial fibrillation he should be placed back on anticoagulation therapy due to  CHA2DS2-VASc score would be at least 2 for age and hypertension.  Due to hypotension, which was slightly symptomatic amlodipine was discontinued.  He was to continue on olmesartan/HCTZ 40/25 mg daily.  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  He continues to do well and denies any rapid heart rhythm, he no longer is using the "bitter cocoa", is avoiding caffeine, and is completely asymptomatic.  He is medically compliant concerning antihypertensive medications.  Past Medical History:  Diagnosis Date   A-fib [facility])    Diabetes mellitus without complication ([place])    Hypertension    Prostate cancer [facility])     Past Surgical History:  Procedure Laterality  Date   LYMPHADENECTOMY Bilateral 03/17/2017   Procedure: PELVIC LYMPHADENECTOMY;  Surgeon: Raynelle Bring, MD;  Location: [facility];  Service: Urology;  Laterality: Bilateral;   PROSTATE BIOPSY     ROBOT ASSISTED LAPAROSCOPIC RADICAL PROSTATECTOMY N/A 03/17/2017   Procedure: XI ROBOTIC ASSISTED LAPAROSCOPIC RADICAL PROSTATECTOMY LEVEL 2;  Surgeon: Raynelle Bring, MD;  Location: [facility];  Service: Urology;  Laterality: N/A;     Current Outpatient Medications  Medication Sig Dispense Refill   Aspirin-Salicylamide-Caffeine (ARTHRITIS STRENGTH BC POWDER PO) Take 1 packet by mouth daily as needed (pain).     ibuprofen (ADVIL) 800 MG tablet Take 800 mg by mouth 2 (two) times daily as needed.     metFORMIN (GLUCOPHAGE) 500 MG tablet Take 500 mg by mouth 2 (two) times daily with a meal.     Multiple Vitamin (MULTIVITAMIN WITH MINERALS) TABS tablet Take 1 tablet by mouth daily. One a Day Men's     Olmesartan-amLODIPine-HCTZ 40-5-25 MG TABS Take by mouth daily.     Olopatadine HCl 0.7 % SOLN Place 1 drop into both eyes at bedtime.     potassium chloride (KLOR-CON) 10 MEQ tablet Take 10 mEq by mouth daily.     tiZANidine (ZANAFLEX) 4 MG tablet Take 4 mg by mouth at bedtime as needed.     Ibuprofen-diphenhydrAMINE Cit (ADVIL PM PO) Take 1 tablet by mouth at bedtime as needed (sleep). (Patient not taking: Reported on 09/04/2021)     No current facility-administered medications for this visit.    Allergies:   Pork-derived products  Social History:  The patient  reports that he has never smoked. He has never used smokeless tobacco. He reports that he does not drink alcohol and does not use drugs.   Family History:  The patient's family history is not on file.    ROS: All other systems are reviewed and negative. Unless otherwise mentioned in H&P    PHYSICAL EXAM: VS:  BP 100/80 (BP Location: Right Arm)   Pulse 71   Ht 5\' 11"  (1.803 m)   Wt 228 lb 6.4 oz (103.6 kg)   SpO2 96%   BMI 31.86 kg/m  ,  BMI Body mass index is 31.86 kg/m. GEN: Well nourished, well developed, in no acute distress HEENT: normal Neck: no JVD, carotid bruits, or masses Cardiac: RRR; no murmurs, rubs, or gallops,no edema  Respiratory:  Clear to auscultation bilaterally, normal work of breathing GI: soft, nontender, nondistended, + BS MS: no deformity or atrophy Skin: warm and dry, no rash Neuro:  Strength and sensation are intact Psych: euthymic mood, full affect   EKG:  EKG is ordered today. The ekg ordered today demonstrates (personally reviewed) normal sinus rhythm with chronic right bundle branch block.  Heart rate of 71 bpm.  No changes from prior EKG.   Recent Labs: No results found for requested labs within last 8760 hours.    Lipid Panel    Component Value Date/Time   CHOL 96 08/27/2018 0329   TRIG 51 08/27/2018 0329   HDL 33 (L) 08/27/2018 0329   CHOLHDL 2.9 08/27/2018 0329   VLDL 10 08/27/2018 0329   LDLCALC 53 08/27/2018 0329      Wt Readings from Last 3 Encounters:  09/04/21 228 lb 6.4 oz (103.6 kg)  09/01/20 231 lb (104.8 kg)  02/14/20 239 lb 6.4 oz (108.6 kg)      Other studies Reviewed:  Echocardiogram: 08/27/2018  Left ventricle: The cavity size was normal. There was mild    concentric hypertrophy. Systolic function was normal. The    estimated ejection fraction was in the range of 55% to 60%. Wall    motion was normal; there were no regional wall motion    abnormalities.  - Aortic valve: There was trivial regurgitation.  - Left atrium: The atrium was mildly dilated.  - Right atrium: The atrium was mildly dilated.   ASSESSMENT AND PLAN:  1.  History of paroxysmal atrial fibrillation: The patient has been in normal sinus rhythm on subsequent visits, and this was felt to be related to highly caffeinated supplement which she was eating called "bitter cocoa "which he no longer uses.  He is not on any anticoagulation.  And has had no recurrence of rapid heart rhythm or  palpitations.  2.  Hypertension: Slightly soft today on this visit.  He denies any dizziness, or near syncope, or fatigue.  We will continue current medication regimen, should he have some dizziness or drop in blood pressure, will make changes in his regimen, likely stopping HCTZ.  Labs are completed by PCP.  3.  Non-insulin-dependent diabetes: Followed by PCP continues on metformin 500 mg twice daily.   Current medicines are reviewed at length with the patient today.  I have spent 45 minutes dedicated to the care of this patient on the date of this encounter to include pre-visit review of records, assessment, management and diagnostic testing,with shared decision making.  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  Labs/ tests ordered today include: None Phillip Williams. [place], ANP, AACC   09/04/2021 5:11 PM    [facility] [place] [address] Office [phone] Fax [phone]  Notice: This dictation was prepared with Dragon dictation along with smaller phrase technology. Any transcriptional errors that result from this process are unintentional and may not be corrected upon review.

## 2021-09-04 ENCOUNTER — Ambulatory Visit (INDEPENDENT_AMBULATORY_CARE_PROVIDER_SITE_OTHER): Payer: Commercial Managed Care - PPO | Admitting: Adult Health

## 2021-09-04 ENCOUNTER — Encounter: Payer: Self-pay | Admitting: Adult Health

## 2021-09-04 ENCOUNTER — Other Ambulatory Visit: Payer: Self-pay

## 2021-09-04 VITALS — BP 100/80 | HR 71 | Ht 71.0 in | Wt 228.4 lb

## 2021-09-04 DIAGNOSIS — I10 Essential (primary) hypertension: Secondary | ICD-10-CM

## 2021-09-04 DIAGNOSIS — N183 Chronic kidney disease, stage 3 unspecified: Secondary | ICD-10-CM

## 2021-09-04 DIAGNOSIS — Z8679 Personal history of other diseases of the circulatory system: Secondary | ICD-10-CM

## 2021-09-04 DIAGNOSIS — E1122 Type 2 diabetes mellitus with diabetic chronic kidney disease: Secondary | ICD-10-CM

## 2021-09-04 NOTE — Patient Instructions (Signed)
Medication Instructions:  The current medical regimen is effective;  continue present plan and medications as directed. Please refer to the Current Medication list given to you today.   *If you need a refill on your cardiac medications before your next appointment, please call your pharmacy*  Lab Work:   Testing/Procedures:  NONE    NONE  Special Instructions NONE  Follow-Up: Your next appointment:  12 month(s) In Person with Elouise Munroe, MD  or Coletta Memos, FNP, Fabian Sharp, PA-C, Sande Rives, PA-C, Caron Presume, PA-C, Jory Sims, DNP, ANP, or Almyra Deforest, PA-C      Please call our office 2 months in advance to schedule this appointment   At [facility], you and your health needs are our priority.  As part of our continuing mission to provide you with exceptional heart care, we have created designated Provider Care Teams.  These Care Teams include your primary Cardiologist (physician) and Advanced Practice Providers (APPs -  Physician Assistants and Nurse Practitioners) who all work together to provide you with the care you need, when you need it.  We recommend signing up for the patient portal called "MyChart".  Sign up information is provided on this After Visit Summary.  MyChart is used to connect with patients for Virtual Visits (Telemedicine).  Patients are able to view lab/test results, encounter notes, upcoming appointments, etc.  Non-urgent messages can be sent to your provider as well.   To learn more about what you can do with MyChart, go to [email or web address].

## 2021-11-27 ENCOUNTER — Other Ambulatory Visit: Payer: Self-pay | Admitting: Internal Medicine

## 2021-11-28 LAB — CBC
HCT: 38.8 % (ref 38.5–50.0)
Hemoglobin: 12.8 g/dL — ABNORMAL LOW (ref 13.2–17.1)
MCH: 28.3 pg (ref 27.0–33.0)
MCHC: 33 g/dL (ref 32.0–36.0)
MCV: 85.7 fL (ref 80.0–100.0)
MPV: 10.7 fL (ref 7.5–12.5)
Platelets: 265 10*3/uL (ref 140–400)
RBC: 4.53 10*6/uL (ref 4.20–5.80)
RDW: 13.8 % (ref 11.0–15.0)
WBC: 4.7 10*3/uL (ref 3.8–10.8)

## 2021-11-28 LAB — FOLATE: Folate: >24 ng/mL

## 2021-11-28 LAB — COMPLETE METABOLIC PANEL WITH GFR
AG Ratio: 1.5 (calc) (ref 1.0–2.5)
ALT: 16 U/L (ref 9–46)
AST: 17 U/L (ref 10–35)
Albumin: 4.3 g/dL (ref 3.6–5.1)
Alkaline phosphatase (APISO): 61 U/L (ref 35–144)
BUN/Creatinine Ratio: 17 (calc) (ref 6–22)
BUN: 27 mg/dL — ABNORMAL HIGH (ref 7–25)
CO2: 25 mmol/L (ref 20–32)
Calcium: 9.8 mg/dL (ref 8.6–10.3)
Chloride: 106 mmol/L (ref 98–110)
Creat: 1.59 mg/dL — ABNORMAL HIGH (ref 0.70–1.28)
Globulin: 2.8 g/dL (calc) (ref 1.9–3.7)
Glucose, Bld: 86 mg/dL (ref 65–99)
Potassium: 4.5 mmol/L (ref 3.5–5.3)
Sodium: 140 mmol/L (ref 135–146)
Total Bilirubin: 0.5 mg/dL (ref 0.2–1.2)
Total Protein: 7.1 g/dL (ref 6.1–8.1)
eGFR: 46 mL/min/{1.73_m2} — ABNORMAL LOW (ref >=60)

## 2021-11-28 LAB — LIPID PANEL
Cholesterol: 133 mg/dL (ref <200)
HDL: 37 mg/dL — ABNORMAL LOW (ref >=40)
LDL Cholesterol (Calc): 82 mg/dL (calc)
Non-HDL Cholesterol (Calc): 96 mg/dL (calc) (ref <130)
Total CHOL/HDL Ratio: 3.6 (calc) (ref <5.0)
Triglycerides: 68 mg/dL (ref <150)

## 2021-11-28 LAB — TSH: TSH: 1.75 mIU/L (ref 0.40–4.50)

## 2021-11-28 LAB — VITAMIN B12: Vitamin B-12: 549 pg/mL (ref 200–1100)

## 2022-07-22 ENCOUNTER — Other Ambulatory Visit: Payer: Self-pay | Admitting: Internal Medicine

## 2022-07-23 LAB — SARS-COV-2 RNA,(COVID-19) QUALITATIVE NAAT: SARS CoV2 RNA: DETECTED — AB

## 2022-07-23 LAB — INFLUENZA A AND B AG, IMMUNOASSAY
INFLUENZA A ANTIGEN: NOT DETECTED
INFLUENZA B ANTIGEN: NOT DETECTED
MICRO NUMBER:: 13983359
SPECIMEN QUALITY:: ADEQUATE

## 2022-09-05 NOTE — Progress Notes (Unsigned)
Cardiology Office Note:    Date:  09/06/2022   ID:  Phillip Williams, DOB 04/17/1951, MRN [id number]  PCP:  Nolene Ebbs, MD  Cardiologist:  Elouise Munroe, MD  Electrophysiologist:  None   Referring MD: Nolene Ebbs, MD   Chief Complaint  Patient presents with   Atrial Fibrillation    History of Present Illness:    Phillip Williams is a 71 y.o. male with a hx of paroxysmal atrial fibrillation, hypertension, hyperlipidemia, T2DM, CKD, prostate cancer who presents for follow-up.  He follows with Dr. Margaretann Loveless.  Presents for DOT paperwork completion.  Denies any chest pain, dyspnea, lightheadedness, syncope, lower extremity edema, or palpitations.  Previous episode of A-fib was thought to be triggered by high caffeine from bitter cocoa consumption, which he has stopped.  Reports he walks 6 days/week for 25 to 30 minutes.  Denies any symptoms.  Echocardiogram 08/27/2018 showed EF 55 to 60%, no significant valvular disease, mild biatrial enlargement.   Past Medical History:  Diagnosis Date   A-fib [facility])    Diabetes mellitus without complication ([place])    Hypertension    Prostate cancer  [facility])     Past Surgical History:  Procedure Laterality Date   LYMPHADENECTOMY Bilateral 03/17/2017   Procedure: PELVIC LYMPHADENECTOMY;  Surgeon: Raynelle Bring, MD;  Location: [facility];  Service: Urology;  Laterality: Bilateral;   PROSTATE BIOPSY     ROBOT ASSISTED LAPAROSCOPIC RADICAL PROSTATECTOMY N/A 03/17/2017   Procedure: XI ROBOTIC ASSISTED LAPAROSCOPIC RADICAL PROSTATECTOMY LEVEL 2;  Surgeon: Raynelle Bring, MD;  Location: [facility];  Service: Urology;  Laterality: N/A;    Current Medications: Current Meds  Medication Sig   Aspirin-Salicylamide-Caffeine (ARTHRITIS STRENGTH BC POWDER PO) Take 1 packet by mouth daily as needed (pain).   ibuprofen (ADVIL) 800 MG tablet Take 800 mg by mouth 2 (two) times daily as needed.   Ibuprofen-diphenhydrAMINE Cit (ADVIL PM PO) Take 1 tablet by mouth at  bedtime as needed (sleep).   metFORMIN (GLUCOPHAGE) 500 MG tablet Take 500 mg by mouth 2 (two) times daily with a meal.   Multiple Vitamin (MULTIVITAMIN WITH MINERALS) TABS tablet Take 1 tablet by mouth daily. One a Day Men's   Olmesartan-amLODIPine-HCTZ 40-5-25 MG TABS Take by mouth daily.   Olopatadine HCl 0.7 % SOLN Place 1 drop into both eyes at bedtime.   potassium chloride (KLOR-CON) 10 MEQ tablet Take 10 mEq by mouth daily.   tiZANidine (ZANAFLEX) 4 MG tablet Take 4 mg by mouth at bedtime as needed.     Allergies:   Pork-derived products   Social History   Socioeconomic History   Marital status: Married    Spouse name: Not on file   Number of children: Not on file   Years of education: Not on file   Highest education level: Not on file  Occupational History   Not on file  Tobacco Use   Smoking status: Never   Smokeless tobacco: Never  Substance and Sexual Activity   Alcohol use: No   Drug use: No   Sexual activity: Not on file  Other Topics Concern   Not on file  Social History Narrative   Not on file   Social Determinants of Health   Financial Resource Strain: Not on file  Food Insecurity: Not on file  Transportation Needs: Not on file  Physical Activity: Not on file  Stress: Not on file  Social Connections: Not on file     Family History: The patient's family history is not on file.  ROS:   Please see the history of present illness.     All other systems reviewed and are negative.  EKGs/Labs/Other Studies Reviewed:    The following studies were reviewed today:   EKG:   09/06/2022: Normal sinus rhythm, right bundle branch block, left axis deviation, rate 63  Recent Labs: 11/27/2021: ALT 16; BUN 27; Creat 1.59; Hemoglobin 12.8; Platelets 265; Potassium 4.5; Sodium 140; TSH 1.75  Recent Lipid Panel    Component Value Date/Time   CHOL 133 11/27/2021 0000   TRIG 68 11/27/2021 0000   HDL 37 (L) 11/27/2021 0000   CHOLHDL 3.6 11/27/2021 0000   VLDL 10  08/27/2018 0329   LDLCALC 82 11/27/2021 0000    Physical Exam:    VS:  BP 126/72   Pulse 63   Ht '5\' 11"'$  (1.803 m)   Wt 220 lb 3.2 oz (99.9 kg)   SpO2 95%   BMI 30.71 kg/m     Wt Readings from Last 3 Encounters:  09/06/22 220 lb 3.2 oz (99.9 kg)  09/04/21 228 lb 6.4 oz (103.6 kg)  09/01/20 231 lb (104.8 kg)     GEN:  Well nourished, well developed in no acute distress HEENT: Normal NECK: No JVD; No carotid bruits LYMPHATICS: No lymphadenopathy CARDIAC: RRR, no murmurs, rubs, gallops RESPIRATORY:  Clear to auscultation without rales, wheezing or rhonchi  ABDOMEN: Soft, non-tender, non-distended MUSCULOSKELETAL:  No edema; No deformity  SKIN: Warm and dry NEUROLOGIC:  Alert and oriented x 3 PSYCHIATRIC:  Normal affect   ASSESSMENT:    1. PAF (paroxysmal atrial fibrillation) ([place])   2. Essential hypertension    PLAN:    Paroxysmal atrial fibrillation: Had single known episode of atrial fibrillation, he was not started on anticoagulation. Previous episode of A-fib was thought to be triggered by high caffeine from bitter cocoa consumption, which he has stopped.  Recommend Zio patch to evaluate for recurrent atrial fibrillation; he is resistant to this but ultimately agreed to Zio patch x 1 week  Hypertension: On olmesartan-amlodipine-HCTZ 40 - 5 - 25 mg daily.  Appears controlled.   RTC in 6 months   Medication Adjustments/Labs and Tests Ordered: Current medicines are reviewed at length with the patient today.  Concerns regarding medicines are outlined above.  Orders Placed This Encounter  Procedures   LONG TERM MONITOR (3-14 DAYS)   No orders of the defined types were placed in this encounter.   Patient Instructions  Medication Instructions:  Your physician recommends that you continue on your current medications as directed. Please refer to the Current Medication list given to you today.  *If you need a refill on your cardiac medications before your next  appointment, please call your pharmacy*  Testing/Procedures: [place] Monitor Instructions   Your physician has requested you wear a ZIO patch monitor for _7_ days.  This is a single patch monitor.   [employer] supplies one patch monitor per enrollment. Additional stickers are not available. Please do not apply patch if you will be having a Nuclear Stress Test, Echocardiogram, Cardiac CT, MRI, or Chest Xray during the period you would be wearing the monitor. The patch cannot be worn during these tests. You cannot remove and re-apply the ZIO XT patch monitor.  Your ZIO patch monitor will be sent Fed Ex from [employer] directly to your home address. It may take 3-5 days to receive your monitor after you have been enrolled.  Once you have received your monitor, please review the  enclosed instructions. Your monitor has already been registered assigning a specific monitor serial # to you.  Billing and Patient Assistance Program Information   We have supplied [employer] with any of your insurance information on file for billing purposes. [employer] offers a sliding scale Patient Assistance Program for patients that do not have insurance, or whose insurance does not completely cover the cost of the ZIO monitor.   You must apply for the Patient Assistance Program to qualify for this discounted rate.     To apply, please call [employer] at [phone], select option 4, then select option 2, and ask to apply for Patient Assistance Program.  Theodore Demark will ask your household income, and how many people are in your household.  They will quote your out-of-pocket cost based on that information.  [employer] will also be able to set up a 53-month interest-free payment plan if needed.  Applying the monitor   Shave hair from upper left chest.  Hold abrader disc by orange tab. Rub abrader in 40 strokes over the upper left chest as indicated in your monitor instructions.  Clean area with 4 enclosed alcohol pads.  Let dry.  Apply patch as indicated in monitor instructions. Patch will be placed under collarbone on left side of chest with arrow pointing upward.  Rub patch adhesive wings for 2 minutes. Remove white label marked "1". Remove the white label marked "2". Rub patch adhesive wings for 2 additional minutes.  While looking in a mirror, press and release button in center of patch. A small green light will flash 3-4 times. This will be your only indicator that the monitor has been turned on. ?  Do not shower for the first 24 hours. You may shower after the first 24 hours.  Press the button if you feel a symptom. You will hear a small click. Record Date, Time and Symptom in the Patient Logbook.  When you are ready to remove the patch, follow instructions on the last 2 pages of the Patient Logbook. Stick patch monitor onto the last page of Patient Logbook.  Place Patient Logbook in the blue and white box.  Use locking tab on box and tape box closed securely.  The blue and white box has prepaid postage on it. Please place it in the mailbox as soon as possible. Your physician should have your test results approximately 7 days after the monitor has been mailed back to [facility]  Call [place]at [phone] you have questions regarding your ZIO XT patch monitor. Call them immediately if you see an orange light blinking on your monitor.  If your monitor falls off in less than 4 days, contact our Monitor department at [phone] ?If your monitor becomes loose or falls off after 4 days call [employer] at [phone] suggestions on securing your monitor.?  Follow-Up: [facility] and your [facility] needs are our priority.  As part of our continuing mission to provide you with exceptional heart care, we have created designated Provider Care Teams.  These Care Teams include your primary Cardiologist (physician) and Advanced Practice Providers (APPs -  Physician  Assistants and Nurse Practitioners) who all work together to provide you with the care you need, when you need it.  We recommend signing up for the patient portal called "MyChart".  Sign up information is provided on this After Visit Summary.  MyChart is used to connect with patients for Virtual Visits (Telemedicine).  Patients are able to view lab/test results,  encounter notes, upcoming appointments, etc.  Non-urgent messages can be sent to your provider as well.   To learn more about what you can do with MyChart, go to [email or web address].    Your next appointment:   6 month(s)  The format for your next appointment:   In Person  Provider:   Elouise Munroe, MD             Signed, Donato Heinz, MD  09/06/2022 11:52 AM    [place]

## 2022-09-06 ENCOUNTER — Ambulatory Visit: Payer: Commercial Managed Care - PPO | Attending: Cardiology

## 2022-09-06 ENCOUNTER — Ambulatory Visit: Payer: Commercial Managed Care - PPO | Attending: Cardiology | Admitting: Cardiology

## 2022-09-06 ENCOUNTER — Encounter: Payer: Self-pay | Admitting: Cardiology

## 2022-09-06 VITALS — BP 126/72 | HR 63 | Ht 71.0 in | Wt 220.2 lb

## 2022-09-06 DIAGNOSIS — I10 Essential (primary) hypertension: Secondary | ICD-10-CM | POA: Diagnosis not present

## 2022-09-06 DIAGNOSIS — I480 Paroxysmal atrial fibrillation: Secondary | ICD-10-CM | POA: Diagnosis not present

## 2022-09-06 NOTE — Progress Notes (Unsigned)
Enrolled for [employer] to mail a ZIO XT long term holter monitor to the patients address on file.  

## 2022-09-06 NOTE — Patient Instructions (Signed)
Medication Instructions:  Your physician recommends that you continue on your current medications as directed. Please refer to the Current Medication list given to you today.  *If you need a refill on your cardiac medications before your next appointment, please call your pharmacy*  Testing/Procedures: [place] Monitor Instructions   Your physician has requested you wear a ZIO patch monitor for _7_ days.  This is a single patch monitor.   [employer] supplies one patch monitor per enrollment. Additional stickers are not available. Please do not apply patch if you will be having a Nuclear Stress Test, Echocardiogram, Cardiac CT, MRI, or Chest Xray during the period you would be wearing the monitor. The patch cannot be worn during these tests. You cannot remove and re-apply the ZIO XT patch monitor.  Your ZIO patch monitor will be sent Fed Ex from [employer] directly to your home address. It may take 3-5 days to receive your monitor after you have been enrolled.  Once you have received your monitor, please review the enclosed instructions. Your monitor has already been registered assigning a specific monitor serial # to you.  Billing and Patient Assistance Program Information   We have supplied [employer] with any of your insurance information on file for billing purposes. [employer] offers a sliding scale Patient Assistance Program for patients that do not have insurance, or whose insurance does not completely cover the cost of the ZIO monitor.   You must apply for the Patient Assistance Program to qualify for this discounted rate.     To apply, please call [employer] at [phone], select option 4, then select option 2, and ask to apply for Patient Assistance Program.  Theodore Demark will ask your household income, and how many people are in your household.  They will quote your out-of-pocket cost based on that information.  [employer] will also be able to set up a 47-month interest-free payment plan  if needed.  Applying the monitor   Shave hair from upper left chest.  Hold abrader disc by orange tab. Rub abrader in 40 strokes over the upper left chest as indicated in your monitor instructions.  Clean area with 4 enclosed alcohol pads. Let dry.  Apply patch as indicated in monitor instructions. Patch will be placed under collarbone on left side of chest with arrow pointing upward.  Rub patch adhesive wings for 2 minutes. Remove white label marked "1". Remove the white label marked "2". Rub patch adhesive wings for 2 additional minutes.  While looking in a mirror, press and release button in center of patch. A small green light will flash 3-4 times. This will be your only indicator that the monitor has been turned on. ?  Do not shower for the first 24 hours. You may shower after the first 24 hours.  Press the button if you feel a symptom. You will hear a small click. Record Date, Time and Symptom in the Patient Logbook.  When you are ready to remove the patch, follow instructions on the last 2 pages of the Patient Logbook. Stick patch monitor onto the last page of Patient Logbook.  Place Patient Logbook in the blue and white box.  Use locking tab on box and tape box closed securely.  The blue and white box has prepaid postage on it. Please place it in the mailbox as soon as possible. Your physician should have your test results approximately 7 days after the monitor has been [facility] to [facility]  Call [facility]  at [phone] if you have questions regarding your ZIO XT patch monitor. Call them immediately if you see an orange light blinking on your monitor.  If your monitor falls off in less than 4 days, contact our Monitor department at [phone]. ?If your monitor becomes loose or falls off after 4 days call [employer] at [phone] for suggestions on securing your monitor.?  Follow-Up: At [facility], you and your health needs are our priority.  As  part of our continuing mission to provide you with exceptional heart care, we have created designated Provider Care Teams.  These Care Teams include your primary Cardiologist (physician) and Advanced Practice Providers (APPs -  Physician Assistants and Nurse Practitioners) who all work together to provide you with the care you need, when you need it.  We recommend signing up for the patient portal called "MyChart".  Sign up information is provided on this After Visit Summary.  MyChart is used to connect with patients for Virtual Visits (Telemedicine).  Patients are able to view lab/test results, encounter notes, upcoming appointments, etc.  Non-urgent messages can be sent to your provider as well.   To learn more about what you can do with MyChart, go to [email or web address].    Your next appointment:   6 month(s)  The format for your next appointment:   In Person  Provider:   Elouise Munroe, MD

## 2022-09-07 NOTE — Addendum Note (Signed)
Addended by: Hinton Dyer on: 09/07/2022 02:47 PM   Modules accepted: Orders

## 2022-09-12 DIAGNOSIS — I480 Paroxysmal atrial fibrillation: Secondary | ICD-10-CM | POA: Diagnosis not present

## 2022-10-06 ENCOUNTER — Encounter: Payer: Self-pay | Admitting: *Deleted

## 2023-03-08 ENCOUNTER — Ambulatory Visit: Payer: Commercial Managed Care - PPO | Attending: Internal Medicine | Admitting: Internal Medicine

## 2023-03-08 VITALS — BP 110/86 | HR 61 | Ht 71.0 in | Wt 220.6 lb

## 2023-03-08 DIAGNOSIS — I10 Essential (primary) hypertension: Secondary | ICD-10-CM

## 2023-03-08 DIAGNOSIS — I480 Paroxysmal atrial fibrillation: Secondary | ICD-10-CM

## 2023-03-08 DIAGNOSIS — E1122 Type 2 diabetes mellitus with diabetic chronic kidney disease: Secondary | ICD-10-CM | POA: Diagnosis not present

## 2023-03-08 DIAGNOSIS — N183 Chronic kidney disease, stage 3 unspecified: Secondary | ICD-10-CM

## 2023-03-08 DIAGNOSIS — Z7984 Long term (current) use of oral hypoglycemic drugs: Secondary | ICD-10-CM

## 2023-03-08 NOTE — Patient Instructions (Signed)
Medication Instructions:  No Changes In Medications at this time.  *If you need a refill on your cardiac medications before your next appointment, please call your pharmacy*  Follow-Up: At [facility], you and your health needs are our priority.  As part of our continuing mission to provide you with exceptional heart care, we have created designated Provider Care Teams.  These Care Teams include your primary Cardiologist (physician) and Advanced Practice Providers (APPs -  Physician Assistants and Nurse Practitioners) who all work together to provide you with the care you need, when you need it.  Your next appointment:   1 year(s)  Provider:   Gayatri A Acharya, MD    

## 2023-03-08 NOTE — Progress Notes (Signed)
Cardiology Office Note:    Date:  03/08/2023   ID:  Phillip Williams, DOB 11-21-1950, MRN [id number]  PCP:  Fleet Contras, MD  Cardiologist:  Parke Poisson, MD  Electrophysiologist:  None   Referring MD: Fleet Contras, MD   No chief complaint on file.   History of Present Illness:    Phillip Williams is a 72 y.o. male with a hx of paroxysmal atrial fibrillation, hypertension, hyperlipidemia, T2DM, CKD, prostate cancer who presents for follow-up.    Denies any chest pain, dyspnea, lightheadedness, syncope, lower extremity edema, or palpitations.  Previous episode of A-fib was thought to be triggered by high caffeine from bitter cocoa consumption, which he has stopped.  139 at the end of his workday and does not have to stop.  At the end of the stairs he will have shortness of breath resolved within 2 minutes.  No chest pain.  Feels this has not significantly changed over the years.  Review cardiac monitor from December which demonstrated no atrial fibrillation.  Echocardiogram 08/27/2018 showed EF 55 to 60%, no significant valvular disease, mild biatrial enlargement.   Past Medical History:  Diagnosis Date   A-fib [facility])    Diabetes mellitus without complication (HCC)    Hypertension    Prostate cancer [facility])     Past Surgical History:  Procedure Laterality Date   LYMPHADENECTOMY Bilateral 03/17/2017   Procedure: PELVIC LYMPHADENECTOMY;  Surgeon: Heloise Purpura, MD;  [facility];  Service: Urology;  Laterality: Bilateral;   PROSTATE BIOPSY     ROBOT ASSISTED LAPAROSCOPIC RADICAL PROSTATECTOMY N/A 03/17/2017   Procedure: XI ROBOTIC ASSISTED LAPAROSCOPIC RADICAL PROSTATECTOMY LEVEL 2;  Surgeon: Heloise Purpura, MD;  [facility];  Service: Urology;  Laterality: N/A;    Current Medications: Current Meds  Medication Sig   Aspirin-Salicylamide-Caffeine (ARTHRITIS STRENGTH BC POWDER PO) Take 1 packet by mouth daily as needed (pain).   ibuprofen (ADVIL) 800 MG tablet  Take 800 mg by mouth 2 (two) times daily as needed.   Ibuprofen-diphenhydrAMINE Cit (ADVIL PM PO) Take 1 tablet by mouth at bedtime as needed (sleep).   metFORMIN (GLUCOPHAGE) 500 MG tablet Take 500 mg by mouth 2 (two) times daily with a meal.   Multiple Vitamin (MULTIVITAMIN WITH MINERALS) TABS tablet Take 1 tablet by mouth daily. One a Day Men's   Olmesartan-amLODIPine-HCTZ 20-5-12.5 MG TABS Take 1 tablet by mouth daily.   Olmesartan-amLODIPine-HCTZ 40-5-25 MG TABS Take by mouth daily.   Olopatadine HCl 0.7 % SOLN Place 1 drop into both eyes at bedtime.   potassium chloride (KLOR-CON) 10 MEQ tablet Take 10 mEq by mouth daily.   tiZANidine (ZANAFLEX) 4 MG tablet Take 4 mg by mouth at bedtime as needed.     Allergies:   Pork-derived products   Social History   Socioeconomic History   Marital status: Married    Spouse name: Not on file   Number of children: Not on file   Years of education: Not on file   Highest education level: Not on file  Occupational History   Not on file  Tobacco Use   Smoking status: Never   Smokeless tobacco: Never  Substance and Sexual Activity   Alcohol use: No   Drug use: No   Sexual activity: Not on file  Other Topics Concern   Not on file  Social History Narrative   Not on file   Social Determinants of Health   Financial Resource Strain: Not on file  Food Insecurity: Not on file  Transportation Needs: Not on file  Physical Activity: Not on file  Stress: Not on file  Social Connections: Not on file     Family History: The patient's family history is not on file.  ROS:   Please see the history of present illness.     All other systems reviewed and are negative.  EKGs/Labs/Other Studies Reviewed:    The following studies were reviewed today:   EKG: 03/08/2023: Sinus rhythm, left axis deviation, right bundle branch block, QRS duration 34 ms, 61 09/06/2022: Normal sinus rhythm, right bundle branch block, left axis deviation, rate  63  Recent Labs: No results found for requested labs within last 365 days.  Recent Lipid Panel    Component Value Date/Time   CHOL 133 11/27/2021 0000   TRIG 68 11/27/2021 0000   HDL 37 (L) 11/27/2021 0000   CHOLHDL 3.6 11/27/2021 0000   VLDL 10 08/27/2018 0329   LDLCALC 82 11/27/2021 0000    Physical Exam:    VS:  BP 110/86   Pulse 61   Ht 5\' 11"  (1.803 m)   Wt 220 lb 9.6 oz (100.1 kg)   SpO2 99%   BMI 30.77 kg/m     Wt Readings from Last 3 Encounters:  03/08/23 220 lb 9.6 oz (100.1 kg)  09/06/22 220 lb 3.2 oz (99.9 kg)  09/04/21 228 lb 6.4 oz (103.6 kg)     Constitutional: No acute distress Eyes: sclera non-icteric, normal conjunctiva and lids ENMT: normal dentition, moist mucous membranes Cardiovascular: regular rhythm, normal rate, no murmur. S1 and S2 normal. No jugular venous distention.  Respiratory: clear to auscultation bilaterally GI : normal bowel sounds, soft and nontender. No distention.   MSK: extremities warm, well perfused. No edema.  NEURO: grossly nonfocal exam, moves all extremities. PSYCH: alert and oriented x 3, normal mood and affect.    ASSESSMENT:    1. PAF (paroxysmal atrial fibrillation) (HCC)   2. Essential hypertension   3. Type 2 diabetes mellitus with stage 3 chronic kidney disease, without long-term current use of insulin, unspecified whether stage 3a or 3b CKD (HCC)     PLAN:    Paroxysmal atrial fibrillation: Had single known episode of atrial fibrillation, he was not started on anticoagulation due to brief nature and known nidus. Previous episode of A-fib was thought to be triggered by high caffeine from bitter cocoa consumption, which he has stopped.  Cardiac monitor showed no atrial fibrillation.  He has been feeling well with no palpitations.  Hypertension: On olmesartan-amlodipine-HCTZ 40 - 5 - 25 mg daily.  Well-controlled  Total time of encounter: 20 minutes total time of encounter, including 10 minutes spent in  face-to-face patient care on the date of this encounter. This time includes coordination of care and counseling regarding above mentioned problem list. Remainder of non-face-to-face time involved reviewing chart documents/testing relevant to the patient encounter and documentation in the medical record. I have independently reviewed documentation from referring provider.   Weston Brass, MD, [facility]   [facility]    Medication Adjustments/Labs and Tests Ordered: Current medicines are reviewed at length with the patient today.  Concerns regarding medicines are outlined above.  Orders Placed This Encounter  Procedures   EKG 12-Lead   No orders of the defined types were placed in this encounter.   Patient Instructions  Medication Instructions:  No Changes In Medications at this time.   *If you need a refill on your cardiac medications before your next appointment, please call  your pharmacy*  Follow-Up: At [facility], you and your health needs are our priority.  As part of our continuing mission to provide you with exceptional heart care, we have created designated Provider Care Teams.  These Care Teams include your primary Cardiologist (physician) and Advanced Practice Providers (APPs -  Physician Assistants and Nurse Practitioners) who all work together to provide you with the care you need, when you need it.  Your next appointment:   1 year(s)  Provider:   Parke Poisson, MD

## 2024-07-04 ENCOUNTER — Ambulatory Visit: Attending: Internal Medicine | Admitting: Internal Medicine

## 2024-07-05 ENCOUNTER — Encounter: Payer: Self-pay | Admitting: Internal Medicine

## 2024-08-03 ENCOUNTER — Ambulatory Visit: Attending: Internal Medicine | Admitting: Internal Medicine

## 2024-08-03 VITALS — BP 120/78 | HR 80 | Ht 67.0 in | Wt 223.8 lb

## 2024-08-03 DIAGNOSIS — I10 Essential (primary) hypertension: Secondary | ICD-10-CM

## 2024-08-03 DIAGNOSIS — N183 Chronic kidney disease, stage 3 unspecified: Secondary | ICD-10-CM

## 2024-08-03 DIAGNOSIS — I4510 Unspecified right bundle-branch block: Secondary | ICD-10-CM

## 2024-08-03 DIAGNOSIS — E1122 Type 2 diabetes mellitus with diabetic chronic kidney disease: Secondary | ICD-10-CM

## 2024-08-03 DIAGNOSIS — I480 Paroxysmal atrial fibrillation: Secondary | ICD-10-CM

## 2024-08-03 NOTE — Patient Instructions (Signed)
 Medication Instructions:  Your physician recommends that you continue on your current medications as directed. Please refer to the Current Medication list given to you today.  *If you need a refill on your cardiac medications before your next appointment, please call your pharmacy*  Lab Work: None If you have labs (blood work) drawn today and your tests are completely normal, you will receive your results only by: MyChart Message (if you have MyChart) OR A paper copy in the mail If you have any lab test that is abnormal or we need to change your treatment, we will call you to review the results.  Testing/Procedures: None  Follow-Up: At [facility], you and your health needs are our priority.  As part of our continuing mission to provide you with exceptional heart care, our providers are all part of one team.  This team includes your primary Cardiologist (physician) and Advanced Practice Providers or APPs (Physician Assistants and Nurse Practitioners) who all work together to provide you with the care you need, when you need it.  Your next appointment:   1 year(s)  Provider:   Any APP    We recommend signing up for the patient portal called MyChart.  Sign up information is provided on this After Visit Summary.  MyChart is used to connect with patients for Virtual Visits (Telemedicine).  Patients are able to view lab/test results, encounter notes, upcoming appointments, etc.  Non-urgent messages can be sent to your provider as well.   To learn more about what you can do with MyChart, go to [email or web address].   Other Instructions

## 2024-08-03 NOTE — Progress Notes (Signed)
 Cardiology Office Note:  .   Date:  08/03/2024  ID:  Zared Knoth, DOB Dec 02, 1950, MRN [id number] PCP: Shelda Atlas, MD  [facility] Providers Cardiologist:  Soyla DELENA Merck, MD    History of Present Illness: .   Phillip Williams is a 73 y.o. male.  Discussed the use of AI scribe software for clinical note transcription with the patient, who gave verbal consent to proceed.  History of Present Illness Phillip Williams is a 73 year old male with paroxysmal atrial fibrillation, hypertension, and type 2 diabetes who presents for cardiovascular follow-up.  He has paroxysmal atrial fibrillation, previously associated with high caffeine intake from bitter cocoa/cola. After discontinuing bitter cocoa, atrial fibrillation resolved. His last echocardiogram showed mild biatrial enlargement, and a cardiac monitor indicated no recurrent atrial fibrillation. No further episodes have occurred since the initial event.  He takes olmesartan  and amlodipine  (5-20 mg daily) for hypertension, with a recent medication adjustment removing hydrochlorothiazide  due to hypotension. Blood pressure is stable.  Type 2 diabetes is managed with Jardiance 10 mg daily. He faces insurance challenges regarding Jardiance coverage but receives samples from his primary care provider.  He has hyperlipidemia and recently started simvastatin  10 mg daily.  No episodes of fainting, dizziness, lightheadedness, or ankle swelling.     ROS: negative except per HPI above.  Studies Reviewed: SABRA   EKG Interpretation Date/Time:  Friday August 03 2024 08:19:59 EDT Ventricular Rate:  77 PR Interval:  168 QRS Duration:  144 QT Interval:  428 QTC Calculation: 484 R Axis:   -41  Text Interpretation: Normal sinus rhythm Left axis deviation Right bundle branch block Confirmed by Merck Soyla ([id number]) on 08/03/2024 8:47:44 AM    Results DIAGNOSTIC Echocardiogram: Mild biatrial enlargement EKG: Right bundle  branch block, no atrial fibrillation (08/03/2024) Risk Assessment/Calculations:    CHA2DS2-VASc Score = 4   This indicates a 4.8% annual risk of stroke. The patient's score is based upon: CHF History: 0 HTN History: 1 Diabetes History: 1 Stroke History: 0 Vascular Disease History: 0 Age Score: 2 Gender Score: 0      Physical Exam:   VS:  BP 120/78 (Patient Position: Sitting)   Pulse 80   Ht 5' 7 (1.702 m)   Wt 223 lb 12.8 oz (101.5 kg)   SpO2 98%   BMI 35.05 kg/m    Wt Readings from Last 3 Encounters:  08/03/24 223 lb 12.8 oz (101.5 kg)  03/08/23 220 lb 9.6 oz (100.1 kg)  09/06/22 220 lb 3.2 oz (99.9 kg)     Physical Exam GENERAL: Alert, cooperative, well developed, no acute distress. HEENT: Normocephalic, normal oropharynx, moist mucous membranes. CHEST: Clear to auscultation bilaterally, no wheezes, rhonchi, or crackles. CARDIOVASCULAR: Normal heart rate and rhythm, S1 and S2 normal without murmurs. ABDOMEN: Soft, non-tender, non-distended, without organomegaly, normal bowel sounds. EXTREMITIES: No cyanosis or edema. NEUROLOGICAL: Cranial nerves grossly intact, moves all extremities without gross motor or sensory deficit.   ASSESSMENT AND PLAN: .    Assessment and Plan Assessment & Plan Paroxysmal atrial fibrillation (history of) Single episode previously triggered by high caffeine intake, resolved after cessation. No recurrent episodes on monitoring. EKG shows no atrial fibrillation. - Avoid high caffeine intake, particularly from bitter cocoa. - Monitor for symptoms such as palpitations or irregular heartbeat. - deferred AC due to single occurrence and close surveillance.  Right bundle branch block Right bundle branch block on EKG, consistent with previous findings, likely age-related. No symptoms.  - Monitor EKG for changes  in conduction. - Report symptoms such as fainting, dizziness, or heart palpitations.  Essential hypertension Hypertension  well-controlled on amlodipine  and olmesartan . Previously on hydrochlorothiazide , stopped due to low blood pressure. Blood pressure remains well-controlled. - Continue antihypertensive regimen of amlodipine  and olmesartan  5-20 mg .        Ilamae Geng, MD, FACC
